# Patient Record
Sex: Male | Born: 1960 | Race: White | Smoking: Former smoker
Health system: Southern US, Community
[De-identification: ages and names within clinical notes are randomized; demographics above are authoritative.]

## PROBLEM LIST (undated history)

## (undated) DIAGNOSIS — E785 Hyperlipidemia, unspecified: Secondary | ICD-10-CM

## (undated) HISTORY — DX: Hyperlipidemia, unspecified: E78.5

---

## 2012-07-17 ENCOUNTER — Encounter: Payer: Self-pay | Admitting: Gastroenterology

## 2012-08-13 ENCOUNTER — Encounter: Payer: Self-pay | Admitting: Gastroenterology

## 2014-02-01 ENCOUNTER — Encounter: Payer: Self-pay | Admitting: Nurse Practitioner

## 2014-06-11 ENCOUNTER — Ambulatory Visit (INDEPENDENT_AMBULATORY_CARE_PROVIDER_SITE_OTHER): Payer: 59

## 2014-06-11 ENCOUNTER — Ambulatory Visit (INDEPENDENT_AMBULATORY_CARE_PROVIDER_SITE_OTHER): Payer: 59 | Admitting: Nurse Practitioner

## 2014-06-11 ENCOUNTER — Encounter: Payer: Self-pay | Admitting: Nurse Practitioner

## 2014-06-11 VITALS — BP 148/105 | HR 110 | Temp 96.8°F | Ht 69.0 in | Wt 201.0 lb

## 2014-06-11 DIAGNOSIS — J449 Chronic obstructive pulmonary disease, unspecified: Secondary | ICD-10-CM | POA: Insufficient documentation

## 2014-06-11 DIAGNOSIS — Z720 Tobacco use: Secondary | ICD-10-CM

## 2014-06-11 DIAGNOSIS — Z125 Encounter for screening for malignant neoplasm of prostate: Secondary | ICD-10-CM

## 2014-06-11 DIAGNOSIS — Z0000 Encounter for general adult medical examination without abnormal findings: Secondary | ICD-10-CM

## 2014-06-11 DIAGNOSIS — Z87891 Personal history of nicotine dependence: Secondary | ICD-10-CM

## 2014-06-11 DIAGNOSIS — F1020 Alcohol dependence, uncomplicated: Secondary | ICD-10-CM

## 2014-06-11 DIAGNOSIS — J410 Simple chronic bronchitis: Secondary | ICD-10-CM

## 2014-06-11 DIAGNOSIS — E785 Hyperlipidemia, unspecified: Secondary | ICD-10-CM

## 2014-06-11 LAB — POCT CBC
GRANULOCYTE PERCENT: 72.7 % (ref 37–80)
HCT, POC: 50.3 % (ref 43.5–53.7)
Hemoglobin: 15.8 g/dL (ref 14.1–18.1)
LYMPH, POC: 2.2 (ref 0.6–3.4)
MCH, POC: 28.6 pg (ref 27–31.2)
MCHC: 31.5 g/dL — AB (ref 31.8–35.4)
MCV: 90.9 fL (ref 80–97)
MPV: 7.6 fL (ref 0–99.8)
PLATELET COUNT, POC: 326 10*3/uL (ref 142–424)
POC GRANULOCYTE: 7.3 — AB (ref 2–6.9)
POC LYMPH PERCENT: 21.9 %L (ref 10–50)
RBC: 5.5 M/uL (ref 4.69–6.13)
RDW, POC: 13 %
WBC: 10 10*3/uL (ref 4.6–10.2)

## 2014-06-11 MED ORDER — PRAVASTATIN SODIUM 40 MG PO TABS: 40.0000 mg | ORAL_TABLET | Freq: Every day | ORAL | Status: DC

## 2014-06-11 NOTE — Patient Instructions (Signed)

## 2014-06-11 NOTE — Addendum Note (Signed)
Addended by: Prescott GumLAND, Karim Aiello M on: 06/11/2014 04:01 PM   Modules accepted: Kipp BroodSmartSet

## 2014-06-11 NOTE — Progress Notes (Addendum)
   Subjective:    Patient ID: Philip Ashley, male    DOB: 10/29/1960, 54 y.o.   MRN: [id number]   Patient here today for annual physical and  follow up of chronic medical problems.   Hyperlipidemia This is a chronic problem. The current episode started more than 1 year ago. The problem is uncontrolled. Recent lipid tests were reviewed and are variable. Current antihyperlipidemic treatment includes statins. The current treatment provides moderate improvement of lipids. Compliance problems include adherence to diet and adherence to exercise.  Risk factors for coronary artery disease include dyslipidemia and male sex.  Alcholism  Says that he drinks 4-5 days a week- Says he only drinks a couple of beers or wine when he does drink. He doesn't think that it is a problem. COPD On  No inhalers- stopped smoking 3 weeks ago- using vapor  Review of Systems  Constitutional: Negative.   HENT: Negative.   Respiratory: Negative.   Cardiovascular: Negative.   Gastrointestinal: Negative.   Genitourinary: Negative.   Neurological: Negative.   Psychiatric/Behavioral: Negative.   All other systems reviewed and are negative.      Objective:   Physical Exam  Constitutional: He is oriented to person, place, and time. He appears well-developed and well-nourished.  HENT:  Head: Normocephalic.  Right Ear: External ear normal.  Left Ear: External ear normal.  Nose: Nose normal.  Mouth/Throat: Oropharynx is clear and moist.  Eyes: EOM are normal. Pupils are equal, round, and reactive to light.  Neck: Normal range of motion. Neck supple. No JVD present. No thyromegaly present.  Cardiovascular: Normal rate, regular rhythm, normal heart sounds and intact distal pulses.  Exam reveals no gallop and no friction rub.   No murmur heard. Pulmonary/Chest: Effort normal and breath sounds normal. No respiratory distress. He has no wheezes. He has no rales. He exhibits no tenderness.  Abdominal: Soft. Bowel sounds  are normal. He exhibits no mass. There is no tenderness.  Musculoskeletal: Normal range of motion. He exhibits no edema.  Lymphadenopathy:    He has no cervical adenopathy.  Neurological: He is alert and oriented to person, place, and time. No cranial nerve deficit.  Skin: Skin is warm and dry.  Psychiatric: He has a normal mood and affect. His behavior is normal. Judgment and thought content normal.   BP 148/105 mmHg  Pulse 110  Temp(Src) 96.8 F (36 C) (Oral)  Ht $R'5\' 9"'Mv$  (1.753 m)  Wt 201 lb (91.173 kg)  BMI 29.67 kg/m2  EKG- NSR-Mary-Margaret Hassell Done, FNP  Chest x ray- No acute findings-Preliminary reading by Ronnald Collum, FNP  [facility]       Assessment & Plan:  1. Simple chronic bronchitis Do not start smoking again  2. Hyperlipidemia with target LDL less than 100 Low fat diet - EKG 12-Lead - CMP14+EGFR - NMR, lipoprofile  3. Alcoholism Stop drinking  4. Annual physical exam - POCT CBC - Thyroid Panel With TSH  5. Smoking hx - DG Chest 2 View; Future  6. Prostate cancer screening - PSA, total and free   Keep diary of blood pressure - let me know if stays above 465 systolic. Labs pending Health maintenance reviewed Diet and exercise encouraged Continue all meds Follow up  In 6 months    [place], FNP

## 2014-06-12 LAB — CMP14+EGFR
A/G RATIO: 2.3 (ref 1.1–2.5)
ALT: 36 IU/L (ref 0–44)
AST: 21 IU/L (ref 0–40)
Albumin: 4.8 g/dL (ref 3.5–5.5)
Alkaline Phosphatase: 67 IU/L (ref 39–117)
BUN/Creatinine Ratio: 13 (ref 9–20)
BUN: 12 mg/dL (ref 6–24)
CALCIUM: 9.8 mg/dL (ref 8.7–10.2)
CHLORIDE: 100 mmol/L (ref 97–108)
CO2: 27 mmol/L (ref 18–29)
CREATININE: 0.9 mg/dL (ref 0.76–1.27)
GFR calc Af Amer: 110 mL/min/{1.73_m2} (ref >59)
GFR calc non Af Amer: 95 mL/min/{1.73_m2} (ref >59)
GLOBULIN, TOTAL: 2.1 g/dL (ref 1.5–4.5)
Glucose: 76 mg/dL (ref 65–99)
Potassium: 4.6 mmol/L (ref 3.5–5.2)
Sodium: 143 mmol/L (ref 134–144)
TOTAL PROTEIN: 6.9 g/dL (ref 6.0–8.5)
Total Bilirubin: 0.5 mg/dL (ref 0.0–1.2)

## 2014-06-12 LAB — THYROID PANEL WITH TSH
FREE THYROXINE INDEX: 2 (ref 1.2–4.9)
T3 Uptake Ratio: 28 % (ref 24–39)
T4 TOTAL: 7.3 ug/dL (ref 4.5–12.0)
TSH: 4.35 u[IU]/mL (ref 0.450–4.500)

## 2014-06-12 LAB — NMR, LIPOPROFILE
CHOLESTEROL: 220 mg/dL — AB (ref 100–199)
HDL Cholesterol by NMR: 67 mg/dL (ref >39)
HDL PARTICLE NUMBER: 47.9 umol/L (ref >=30.5)
LDL Particle Number: 1561 nmol/L — ABNORMAL HIGH (ref <1000)
LDL SIZE: 20.5 nm (ref >20.5)
LDL-C: 103 mg/dL — AB (ref 0–99)
LP-IR SCORE: 74 — AB (ref <=45)
SMALL LDL PARTICLE NUMBER: 723 nmol/L — AB (ref <=527)
Triglycerides by NMR: 250 mg/dL — ABNORMAL HIGH (ref 0–149)

## 2014-06-12 LAB — PSA, TOTAL AND FREE
PSA FREE PCT: 21 %
PSA FREE: 0.21 ng/mL
PSA: 1 ng/mL (ref 0.0–4.0)

## 2014-06-17 ENCOUNTER — Other Ambulatory Visit: Payer: Self-pay | Admitting: *Deleted

## 2014-06-17 MED ORDER — BUDESONIDE-FORMOTEROL FUMARATE 80-4.5 MCG/ACT IN AERO: 2.0000 | INHALATION_SPRAY | Freq: Two times a day (BID) | RESPIRATORY_TRACT | Status: DC

## 2014-06-22 ENCOUNTER — Other Ambulatory Visit: Payer: Self-pay | Admitting: Nurse Practitioner

## 2014-06-22 MED ORDER — LISINOPRIL 20 MG PO TABS: 20.0000 mg | ORAL_TABLET | Freq: Every day | ORAL | Status: DC

## 2014-10-21 ENCOUNTER — Other Ambulatory Visit: Payer: Self-pay | Admitting: *Deleted

## 2014-10-21 MED ORDER — BUDESONIDE-FORMOTEROL FUMARATE 80-4.5 MCG/ACT IN AERO: 2.0000 | INHALATION_SPRAY | Freq: Two times a day (BID) | RESPIRATORY_TRACT | Status: DC

## 2014-11-24 ENCOUNTER — Other Ambulatory Visit: Payer: Self-pay | Admitting: *Deleted

## 2014-11-24 MED ORDER — LISINOPRIL 20 MG PO TABS: 20.0000 mg | ORAL_TABLET | Freq: Every day | ORAL | Status: DC

## 2014-12-21 ENCOUNTER — Other Ambulatory Visit: Payer: Self-pay | Admitting: *Deleted

## 2014-12-21 MED ORDER — PRAVASTATIN SODIUM 40 MG PO TABS: 40.0000 mg | ORAL_TABLET | Freq: Every day | ORAL | Status: DC

## 2014-12-21 MED ORDER — BUDESONIDE-FORMOTEROL FUMARATE 80-4.5 MCG/ACT IN AERO: 2.0000 | INHALATION_SPRAY | Freq: Two times a day (BID) | RESPIRATORY_TRACT | Status: DC

## 2014-12-21 MED ORDER — LISINOPRIL 20 MG PO TABS: 20.0000 mg | ORAL_TABLET | Freq: Every day | ORAL | Status: DC

## 2015-01-21 ENCOUNTER — Ambulatory Visit (INDEPENDENT_AMBULATORY_CARE_PROVIDER_SITE_OTHER): Payer: 59 | Admitting: Nurse Practitioner

## 2015-01-21 ENCOUNTER — Encounter: Payer: Self-pay | Admitting: Nurse Practitioner

## 2015-01-21 VITALS — BP 152/86 | HR 77 | Temp 97.7°F | Ht 69.0 in | Wt 202.0 lb

## 2015-01-21 DIAGNOSIS — I10 Essential (primary) hypertension: Secondary | ICD-10-CM | POA: Diagnosis not present

## 2015-01-21 DIAGNOSIS — Z6829 Body mass index (BMI) 29.0-29.9, adult: Secondary | ICD-10-CM | POA: Diagnosis not present

## 2015-01-21 DIAGNOSIS — J410 Simple chronic bronchitis: Secondary | ICD-10-CM

## 2015-01-21 DIAGNOSIS — E785 Hyperlipidemia, unspecified: Secondary | ICD-10-CM

## 2015-01-21 DIAGNOSIS — F1020 Alcohol dependence, uncomplicated: Secondary | ICD-10-CM | POA: Diagnosis not present

## 2015-01-21 DIAGNOSIS — Z1159 Encounter for screening for other viral diseases: Secondary | ICD-10-CM

## 2015-01-21 MED ORDER — PRAVASTATIN SODIUM 40 MG PO TABS: 40.0000 mg | ORAL_TABLET | Freq: Every day | ORAL | Status: DC

## 2015-01-21 MED ORDER — LISINOPRIL 40 MG PO TABS: 40.0000 mg | ORAL_TABLET | Freq: Every day | ORAL | Status: DC

## 2015-01-21 MED ORDER — BUDESONIDE-FORMOTEROL FUMARATE 80-4.5 MCG/ACT IN AERO: 2.0000 | INHALATION_SPRAY | Freq: Two times a day (BID) | RESPIRATORY_TRACT | Status: DC

## 2015-01-21 NOTE — Progress Notes (Signed)
Subjective:    Patient ID: Philip Ashley, male    DOB: [birth year], 54 y.o.   MRN: [id number]   Patient here today for annual physical and  follow up of chronic medical problems.   Hyperlipidemia This is a chronic problem. The current episode started more than 1 year ago. The problem is uncontrolled. Recent lipid tests were reviewed and are variable. Pertinent negatives include no chest pain or shortness of breath. Current antihyperlipidemic treatment includes statins. The current treatment provides moderate improvement of lipids. Compliance problems include adherence to diet and adherence to exercise.  Risk factors for coronary artery disease include dyslipidemia and male sex.  Hypertension This is a chronic problem. The current episode started more than 1 year ago. The problem has been waxing and waning since onset. The problem is uncontrolled. Pertinent negatives include no chest pain, malaise/fatigue or shortness of breath. Risk factors for coronary artery disease include dyslipidemia and male gender. Past treatments include ACE inhibitors. The current treatment provides moderate improvement. Compliance problems include diet and exercise.  There is no history of CAD/MI or CVA.  Alcholism  Says that he drinks 4-5 days a week- Says he only drinks a couple of beers or wine when he does drink. He doesn't think that it is a problem. COPD On  No inhalers- stopped smoking 3 weeks ago- using vapor  Review of Systems  Constitutional: Negative.  Negative for malaise/fatigue.  HENT: Negative.   Respiratory: Negative.  Negative for shortness of breath.   Cardiovascular: Negative.  Negative for chest pain.  Gastrointestinal: Negative.   Genitourinary: Negative.   Neurological: Negative.   Psychiatric/Behavioral: Negative.   All other systems reviewed and are negative.      Objective:   Physical Exam  Constitutional: He is oriented to person, place, and time. He appears well-developed and  well-nourished.  HENT:  Head: Normocephalic.  Right Ear: External ear normal.  Left Ear: External ear normal.  Nose: Nose normal.  Mouth/Throat: Oropharynx is clear and moist.  Eyes: EOM are normal. Pupils are equal, round, and reactive to light.  Neck: Normal range of motion. Neck supple. No JVD present. No thyromegaly present.  Cardiovascular: Normal rate, regular rhythm, normal heart sounds and intact distal pulses.  Exam reveals no gallop and no friction rub.   No murmur heard. Pulmonary/Chest: Effort normal and breath sounds normal. No respiratory distress. He has no wheezes. He has no rales. He exhibits no tenderness.  Abdominal: Soft. Bowel sounds are normal. He exhibits no mass. There is no tenderness.  Musculoskeletal: Normal range of motion. He exhibits no edema.  Lymphadenopathy:    He has no cervical adenopathy.  Neurological: He is alert and oriented to person, place, and time. No cranial nerve deficit.  Skin: Skin is warm and dry.  Psychiatric: He has a normal mood and affect. His behavior is normal. Judgment and thought content normal.   BP 152/86 mmHg  Pulse 77  Temp(Src) 97.7 F (36.5 C) (Oral)  Ht '5\' 9"'  (1.753 m)  Wt 202 lb (91.627 kg)  BMI 29.82 kg/m2      Assessment & Plan:  1. Simple chronic bronchitis Avoid cigarette smoke - budesonide-formoterol (SYMBICORT) 80-4.5 MCG/ACT inhaler; Inhale 2 puffs into the lungs 2 (two) times daily.  Dispense: 1 Inhaler; Refill: 0  2. Hyperlipidemia with target LDL less than 100 Low fat diet - Lipid panel - pravastatin (PRAVACHOL) 40 MG tablet; Take 1 tablet (40 mg total) by mouth daily.  Dispense: 30 tablet; Refill:  0  3. Alcoholism Avoid alcohol  4. BMI 29.0-29.9,adult Discussed diet and exercise for person with BMI >25 Will recheck weight in 3-6 months   5. Essential hypertension Do not add salt to diet Increased lisinopril from 20 mg to 40 mg daily - CMP14+EGFR - lisinopril (PRINIVIL,ZESTRIL) 40 MG tablet;  Take 1 tablet (40 mg total) by mouth daily.  Dispense: 90 tablet; Refill: 3  6. Need for hepatitis C screening test - Hepatitis C antibody    Labs pending Health maintenance reviewed Diet and exercise encouraged Continue all meds Follow up  In 3 months   [place], FNP

## 2015-01-21 NOTE — Patient Instructions (Signed)

## 2015-01-22 LAB — CMP14+EGFR
A/G RATIO: 2.6 — AB (ref 1.1–2.5)
ALBUMIN: 4.6 g/dL (ref 3.5–5.5)
ALT: 41 IU/L (ref 0–44)
AST: 27 IU/L (ref 0–40)
Alkaline Phosphatase: 65 IU/L (ref 39–117)
BILIRUBIN TOTAL: 0.4 mg/dL (ref 0.0–1.2)
BUN/Creatinine Ratio: 15 (ref 9–20)
BUN: 14 mg/dL (ref 6–24)
CHLORIDE: 101 mmol/L (ref 97–108)
CO2: 27 mmol/L (ref 18–29)
Calcium: 9.8 mg/dL (ref 8.7–10.2)
Creatinine, Ser: 0.94 mg/dL (ref 0.76–1.27)
GFR calc non Af Amer: 90 mL/min/{1.73_m2} (ref >59)
GFR, EST AFRICAN AMERICAN: 104 mL/min/{1.73_m2} (ref >59)
GLOBULIN, TOTAL: 1.8 g/dL (ref 1.5–4.5)
Glucose: 77 mg/dL (ref 65–99)
POTASSIUM: 4.5 mmol/L (ref 3.5–5.2)
SODIUM: 143 mmol/L (ref 134–144)
TOTAL PROTEIN: 6.4 g/dL (ref 6.0–8.5)

## 2015-01-22 LAB — HEPATITIS C ANTIBODY

## 2015-01-22 LAB — LIPID PANEL
Chol/HDL Ratio: 3.1 ratio units (ref 0.0–5.0)
Cholesterol, Total: 181 mg/dL (ref 100–199)
HDL: 58 mg/dL (ref >39)
LDL Calculated: 88 mg/dL (ref 0–99)
Triglycerides: 176 mg/dL — ABNORMAL HIGH (ref 0–149)
VLDL Cholesterol Cal: 35 mg/dL (ref 5–40)

## 2015-01-24 ENCOUNTER — Other Ambulatory Visit: Payer: Self-pay | Admitting: *Deleted

## 2015-01-24 DIAGNOSIS — E785 Hyperlipidemia, unspecified: Secondary | ICD-10-CM

## 2015-01-24 MED ORDER — PRAVASTATIN SODIUM 40 MG PO TABS: 40.0000 mg | ORAL_TABLET | Freq: Every day | ORAL | Status: DC

## 2015-02-01 ENCOUNTER — Ambulatory Visit (INDEPENDENT_AMBULATORY_CARE_PROVIDER_SITE_OTHER): Payer: 59 | Admitting: Nurse Practitioner

## 2015-02-01 ENCOUNTER — Encounter: Payer: Self-pay | Admitting: Nurse Practitioner

## 2015-02-01 VITALS — BP 184/103 | HR 83 | Temp 97.6°F | Ht 69.0 in | Wt 201.0 lb

## 2015-02-01 DIAGNOSIS — I10 Essential (primary) hypertension: Secondary | ICD-10-CM | POA: Diagnosis not present

## 2015-02-01 MED ORDER — AMLODIPINE BESYLATE 10 MG PO TABS: 10.0000 mg | ORAL_TABLET | Freq: Every day | ORAL | Status: DC

## 2015-02-01 NOTE — Addendum Note (Signed)
Addended by: Bennie Pierini on: 02/01/2015 09:08 AM   Modules accepted: Kipp Brood

## 2015-02-01 NOTE — Patient Instructions (Signed)

## 2015-02-01 NOTE — Progress Notes (Signed)
   Subjective:    Patient ID: Philip Ashley, male    DOB: 1960/06/18, 54 y.o.   MRN: [id number]  HPI Patient in c/o blood pressure being elevated- was seen 1 week ago and lisinorpil was increased from  to 40 mg daily. Blood pressure is still running 170's systolic and 100's diastolic. He feels very tired and weak. Was unable to work yesterday and today. Having lots of trouble sleeping and wonders if that is causing problem.   Review of Systems  Constitutional: Positive for fatigue.  HENT: Negative.   Eyes: Negative for photophobia and visual disturbance.  Respiratory: Negative.   Cardiovascular: Negative.   Gastrointestinal: Negative.   Genitourinary: Negative.   Neurological: Negative.   Psychiatric/Behavioral: Negative.   All other systems reviewed and are negative.      Objective:   Physical Exam  Constitutional: He is oriented to person, place, and time. He appears well-developed and well-nourished.  Cardiovascular: Normal rate, regular rhythm and normal heart sounds.   Pulmonary/Chest: Effort normal and breath sounds normal.  Neurological: He is alert and oriented to person, place, and time.  Skin: Skin is warm.  Psychiatric: He has a normal mood and affect. His behavior is normal. Judgment and thought content normal.   BP 184/103 mmHg  Pulse 83  Temp(Src) 97.6 F (36.4 C) (Oral)  Ht  (1.753 m)  Wt 201 lb (91.173 kg)  BMI 29.67 kg/m2        Assessment & Plan:   1. Essential hypertension    Meds ordered this encounter  Medications  . amLODipine (NORVASC) 10 MG tablet    Sig: Take 1 tablet (10 mg total) by mouth daily.    Dispense:  90 tablet    Refill:  3    Order Specific Question:  Supervising Provider    Answer:  Ernestina Penna [[id number]]   Continue lisinopril as rx Do not add salt to diet Get someone to check blood pressure at home RTO prn  Mary-Margaret Daphine Deutscher, FNP

## 2015-02-03 ENCOUNTER — Ambulatory Visit: Payer: 59 | Admitting: Nurse Practitioner

## 2015-03-31 ENCOUNTER — Encounter: Payer: Self-pay | Admitting: *Deleted

## 2015-06-25 IMAGING — CR DG CHEST 2V
2 series · 2 of 2 positions shown · non-contrast
Comparison: None.

CLINICAL DATA: Annual physical.  Smoker.

EXAM:
CHEST  2 VIEW

[view not recorded (1 of 2)]
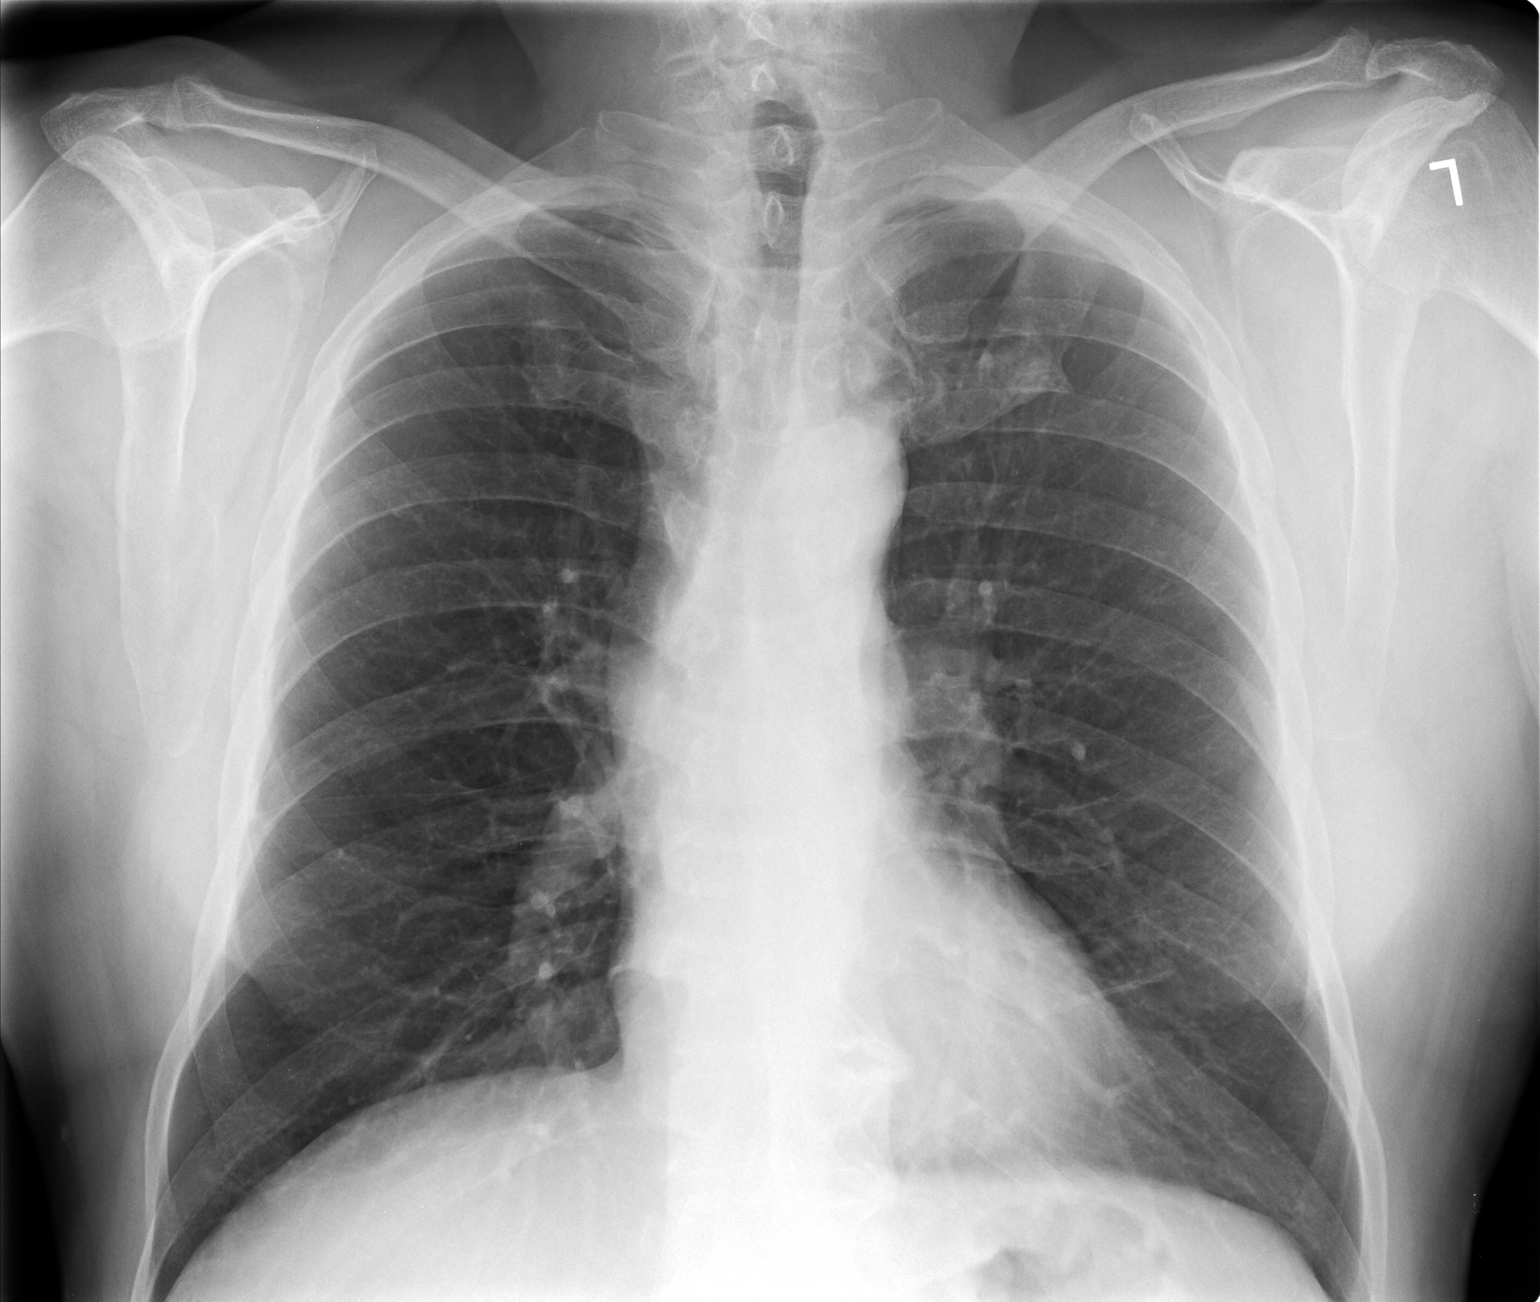

[view not recorded (2 of 2)]
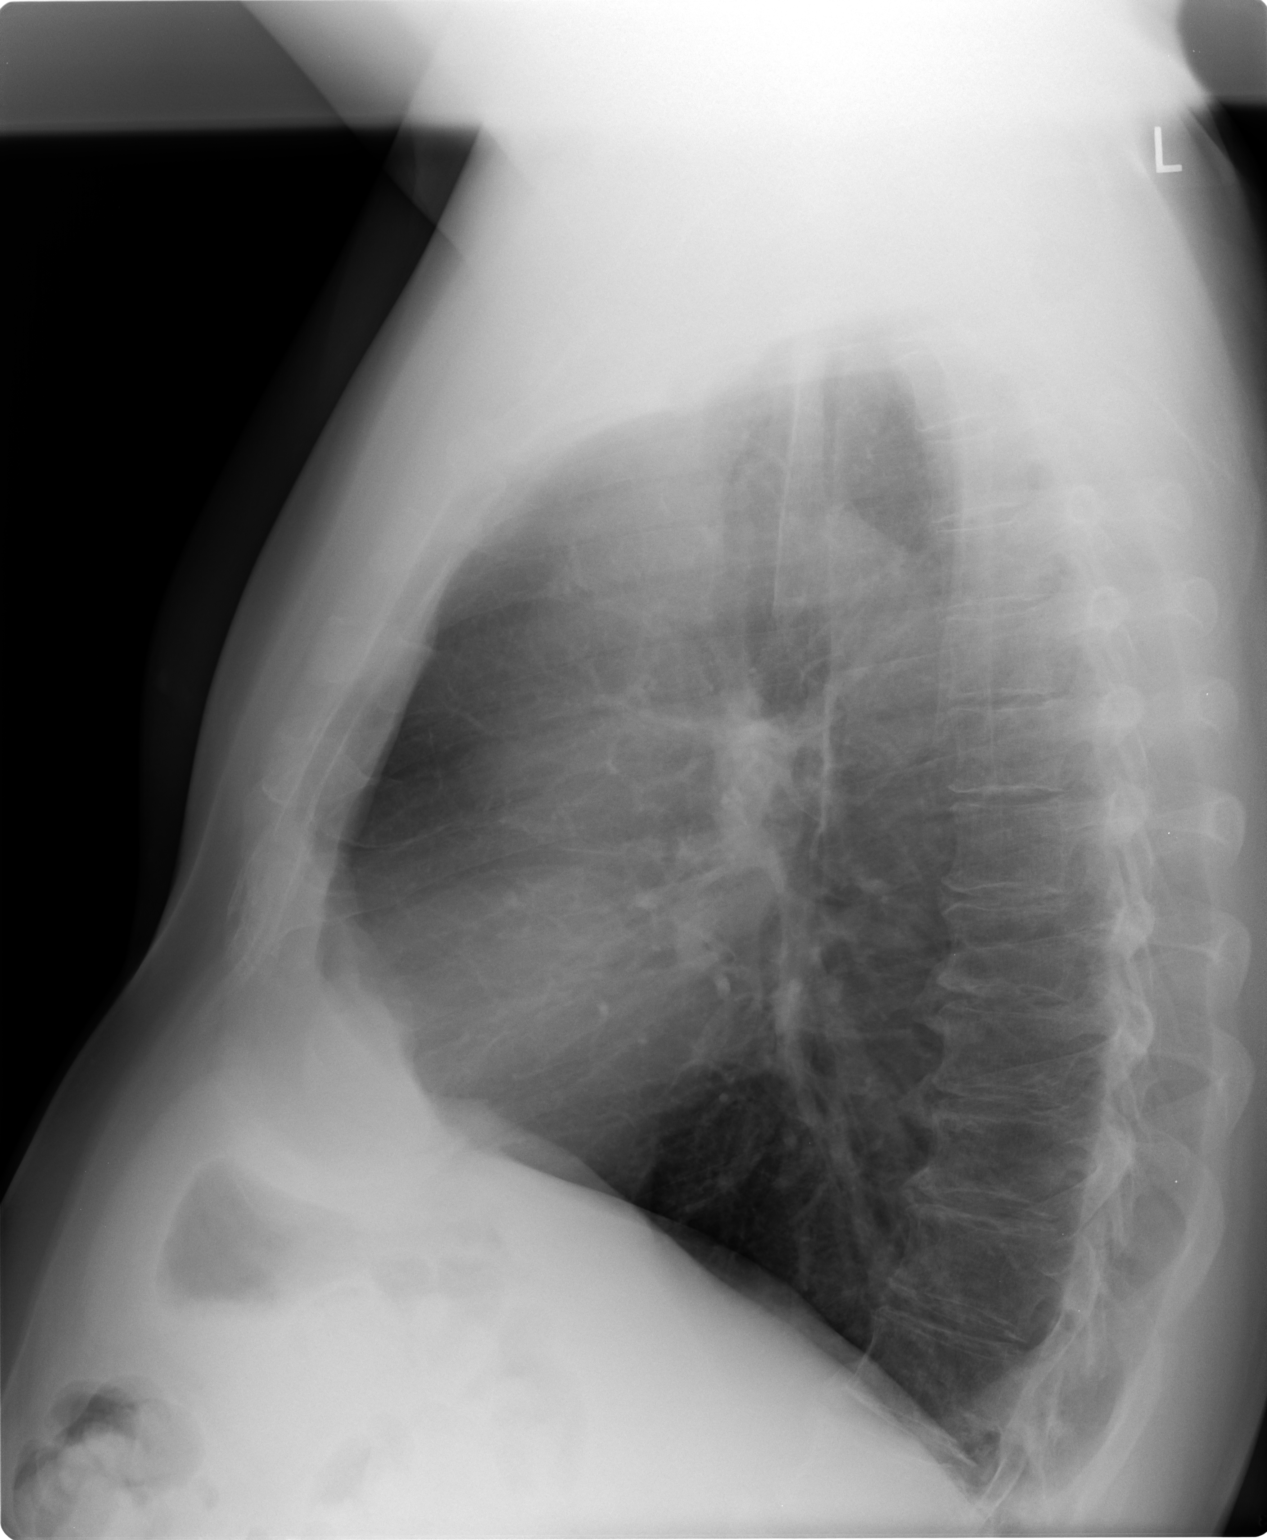

[2 of 2 positions shown; findings below may reference images not displayed]

FINDINGS: The heart size and mediastinal contours are within normal limits.
Both lungs are clear. Mild hyperinflation is suspicious for COPD. No
evidence of pleural effusion. No mass or lymphadenopathy identified.
Mild lower thoracic spine degenerative changes noted.
IMPRESSION: Probable COPD.  No active cardiopulmonary disease.

## 2015-07-29 ENCOUNTER — Other Ambulatory Visit: Payer: Self-pay | Admitting: Nurse Practitioner

## 2015-11-04 ENCOUNTER — Ambulatory Visit (INDEPENDENT_AMBULATORY_CARE_PROVIDER_SITE_OTHER): Payer: 59 | Admitting: Family

## 2015-11-04 ENCOUNTER — Encounter: Payer: Self-pay | Admitting: Family

## 2015-11-04 VITALS — BP 139/83 | Temp 97.4°F | Ht 69.0 in | Wt 202.0 lb

## 2015-11-04 DIAGNOSIS — M5442 Lumbago with sciatica, left side: Secondary | ICD-10-CM | POA: Diagnosis not present

## 2015-11-04 DIAGNOSIS — Z1211 Encounter for screening for malignant neoplasm of colon: Secondary | ICD-10-CM | POA: Diagnosis not present

## 2015-11-04 MED ORDER — NAPROXEN 500 MG PO TABS: 500.0000 mg | ORAL_TABLET | Freq: Two times a day (BID) | ORAL | Status: DC

## 2015-11-04 MED ORDER — KETOROLAC TROMETHAMINE 60 MG/2ML IM SOLN
60.0000 mg | Freq: Once | INTRAMUSCULAR | Status: AC
  Administered 2015-11-04: 60 mg via INTRAMUSCULAR

## 2015-11-04 MED ORDER — CYCLOBENZAPRINE HCL 5 MG PO TABS: 5.0000 mg | ORAL_TABLET | Freq: Three times a day (TID) | ORAL | Status: DC | PRN

## 2015-11-04 MED ORDER — METHYLPREDNISOLONE ACETATE 80 MG/ML IJ SUSP
80.0000 mg | Freq: Once | INTRAMUSCULAR | Status: AC
  Administered 2015-11-04: 80 mg via INTRAMUSCULAR

## 2015-11-04 NOTE — Progress Notes (Signed)
   Subjective:    Patient ID: Philip Ashley, male    DOB: 1961/04/22, 55 y.o.   MRN: [id number]  Back Pain This is a new problem. The current episode started 1 to 4 weeks ago. The problem occurs constantly. The problem is unchanged. The pain is present in the lumbar spine. The quality of the pain is described as aching. The pain radiates to the left foot and left thigh. The pain is at a severity of 8/10. The pain is moderate. The symptoms are aggravated by bending, standing and twisting. Associated symptoms include leg pain, numbness, tingling and weakness. Pertinent negatives include no bladder incontinence, bowel incontinence, dysuria, fever or pelvic pain. Risk factors include obesity. He has tried analgesics, muscle relaxant and NSAIDs for the symptoms. The treatment provided mild relief.      Review of Systems  Constitutional: Negative for fever.  HENT: Negative.   Respiratory: Negative.   Cardiovascular: Negative.   Gastrointestinal: Negative.  Negative for bowel incontinence.  Endocrine: Negative.   Genitourinary: Negative.  Negative for bladder incontinence, dysuria and pelvic pain.  Musculoskeletal: Positive for back pain.  Neurological: Positive for tingling, weakness and numbness.  Hematological: Negative.   Psychiatric/Behavioral: Negative.   All other systems reviewed and are negative.      Objective:   Physical Exam  Constitutional: He is oriented to person, place, and time. He appears well-developed and well-nourished. No distress.  HENT:  Head: Normocephalic.  Eyes: Pupils are equal, round, and reactive to light. Right eye exhibits no discharge. Left eye exhibits no discharge.  Neck: Normal range of motion. Neck supple. No thyromegaly present.  Cardiovascular: Normal rate, regular rhythm, normal heart sounds and intact distal pulses.   No murmur heard. Pulmonary/Chest: Effort normal and breath sounds normal. No respiratory distress. He has no wheezes.  Abdominal:  Soft. Bowel sounds are normal. He exhibits no distension. There is no tenderness.  Musculoskeletal: Normal range of motion. He exhibits no edema or tenderness.  Neurological: He is alert and oriented to person, place, and time.  Skin: Skin is warm and dry. No rash noted. No erythema.  Psychiatric: He has a normal mood and affect. His behavior is normal. Judgment and thought content normal.  Vitals reviewed.   BP 139/83 mmHg  Temp(Src) 97.4 F (36.3 C) (Oral)  Ht 5\' 9"  (1.753 m)  Wt 202 lb (91.627 kg)  BMI 29.82 kg/m2       Assessment & Plan:  1. Left-sided low back pain with left-sided sciatica -Rest -Ice and heat as needed -ROM exercises and stretches discussed- Handout given - RTO prn  - methylPREDNISolone acetate (DEPO-MEDROL) injection 80 mg; Inject 1 mL (80 mg total) into the muscle once. - ketorolac (TORADOL) injection 60 mg; Inject 2 mLs (60 mg total) into the muscle once. - naproxen (NAPROSYN) 500 MG tablet; Take 1 tablet (500 mg total) by mouth 2 (two) times daily with a meal.  Dispense: 60 tablet; Refill: 1 - cyclobenzaprine (FLEXERIL) 5 MG tablet; Take 1 tablet (5 mg total) by mouth 3 (three) times daily as needed for muscle spasms.  Dispense: 60 tablet; Refill: 1   2. Colon cancer screening - Fecal occult blood, imunochemical; Future  Jannifer Rodneyhristy Hawks, FNP

## 2015-11-04 NOTE — Patient Instructions (Signed)
Sciatica With Rehab The sciatic nerve runs from the back down the leg and is responsible for sensation and control of the muscles in the back (posterior) side of the thigh, lower leg, and foot. Sciatica is a condition that is characterized by inflammation of this nerve.  SYMPTOMS   Signs of nerve damage, including numbness and/or weakness along the posterior side of the lower extremity.  Pain in the back of the thigh that may also travel down the leg.  Pain that worsens when sitting for long periods of time.  Occasionally, pain in the back or buttock. CAUSES  Inflammation of the sciatic nerve is the cause of sciatica. The inflammation is due to something irritating the nerve. Common sources of irritation include:  Sitting for long periods of time.  Direct trauma to the nerve.  Arthritis of the spine.  Herniated or ruptured disk.  Slipping of the vertebrae (spondylolisthesis).  Pressure from soft tissues, such as muscles or ligament-like tissue (fascia). RISK INCREASES WITH:  Sports that place pressure or stress on the spine (football or weightlifting).  Poor strength and flexibility.  Failure to warm up properly before activity.  Family history of low back pain or disk disorders.  Previous back injury or surgery.  Poor body mechanics, especially when lifting, or poor posture. PREVENTION   Warm up and stretch properly before activity.  Maintain physical fitness:  Strength, flexibility, and endurance.  Cardiovascular fitness.  Learn and use proper technique, especially with posture and lifting. When possible, have coach correct improper technique.  Avoid activities that place stress on the spine. PROGNOSIS If treated properly, then sciatica usually resolves within 6 weeks. However, occasionally surgery is necessary.  RELATED COMPLICATIONS   Permanent nerve damage, including pain, numbness, tingle, or weakness.  Chronic back pain.  Risks of surgery: infection,  bleeding, nerve damage, or damage to surrounding tissues. TREATMENT Treatment initially involves resting from any activities that aggravate your symptoms. The use of ice and medication may help reduce pain and inflammation. The use of strengthening and stretching exercises may help reduce pain with activity. These exercises may be performed at home or with referral to a therapist. A therapist may recommend further treatments, such as transcutaneous electronic nerve stimulation (TENS) or ultrasound. Your caregiver may recommend corticosteroid injections to help reduce inflammation of the sciatic nerve. If symptoms persist despite non-surgical (conservative) treatment, then surgery may be recommended. MEDICATION  If pain medication is necessary, then nonsteroidal anti-inflammatory medications, such as aspirin and ibuprofen, or other minor pain relievers, such as acetaminophen, are often recommended.  Do not take pain medication for 7 days before surgery.  Prescription pain relievers may be given if deemed necessary by your caregiver. Use only as directed and only as much as you need.  Ointments applied to the skin may be helpful.  Corticosteroid injections may be given by your caregiver. These injections should be reserved for the most serious cases, because they may only be given a certain number of times. HEAT AND COLD  Cold treatment (icing) relieves pain and reduces inflammation. Cold treatment should be applied for 10 to 15 minutes every 2 to 3 hours for inflammation and pain and immediately after any activity that aggravates your symptoms. Use ice packs or massage the area with a piece of ice (ice massage).  Heat treatment may be used prior to performing the stretching and strengthening activities prescribed by your caregiver, physical therapist, or athletic trainer. Use a heat pack or soak the injury in warm water.   SEEK MEDICAL CARE IF:  Treatment seems to offer no benefit, or the condition  worsens.  Any medications produce adverse side effects. EXERCISES  RANGE OF MOTION (ROM) AND STRETCHING EXERCISES - Sciatica Most people with sciatic will find that their symptoms worsen with either excessive bending forward (flexion) or arching at the low back (extension). The exercises which will help resolve your symptoms will focus on the opposite motion. Your physician, physical therapist or athletic trainer will help you determine which exercises will be most helpful to resolve your low back pain. Do not complete any exercises without first consulting with your clinician. Discontinue any exercises which worsen your symptoms until you speak to your clinician. If you have pain, numbness or tingling which travels down into your buttocks, leg or foot, the goal of the therapy is for these symptoms to move closer to your back and eventually resolve. Occasionally, these leg symptoms will get better, but your low back pain may worsen; this is typically an indication of progress in your rehabilitation. Be certain to be very alert to any changes in your symptoms and the activities in which you participated in the 24 hours prior to the change. Sharing this information with your clinician will allow him/her to most efficiently treat your condition. These exercises may help you when beginning to rehabilitate your injury. Your symptoms may resolve with or without further involvement from your physician, physical therapist or athletic trainer. While completing these exercises, remember:   Restoring tissue flexibility helps normal motion to return to the joints. This allows healthier, less painful movement and activity.  An effective stretch should be held for at least 30 seconds.  A stretch should never be painful. You should only feel a gentle lengthening or release in the stretched tissue. FLEXION RANGE OF MOTION AND STRETCHING EXERCISES: STRETCH - Flexion, Single Knee to Chest   Lie on a firm bed or floor  with both legs extended in front of you.  Keeping one leg in contact with the floor, bring your opposite knee to your chest. Hold your leg in place by either grabbing behind your thigh or at your knee.  Pull until you feel a gentle stretch in your low back. Hold __________ seconds.  Slowly release your grasp and repeat the exercise with the opposite side. Repeat __________ times. Complete this exercise __________ times per day.  STRETCH - Flexion, Double Knee to Chest  Lie on a firm bed or floor with both legs extended in front of you.  Keeping one leg in contact with the floor, bring your opposite knee to your chest.  Tense your stomach muscles to support your back and then lift your other knee to your chest. Hold your legs in place by either grabbing behind your thighs or at your knees.  Pull both knees toward your chest until you feel a gentle stretch in your low back. Hold __________ seconds.  Tense your stomach muscles and slowly return one leg at a time to the floor. Repeat __________ times. Complete this exercise __________ times per day.  STRETCH - Low Trunk Rotation   Lie on a firm bed or floor. Keeping your legs in front of you, bend your knees so they are both pointed toward the ceiling and your feet are flat on the floor.  Extend your arms out to the side. This will stabilize your upper body by keeping your shoulders in contact with the floor.  Gently and slowly drop both knees together to one side until   you feel a gentle stretch in your low back. Hold for __________ seconds.  Tense your stomach muscles to support your low back as you bring your knees back to the starting position. Repeat the exercise to the other side. Repeat __________ times. Complete this exercise __________ times per day  EXTENSION RANGE OF MOTION AND FLEXIBILITY EXERCISES: STRETCH - Extension, Prone on Elbows  Lie on your stomach on the floor, a bed will be too soft. Place your palms about shoulder  width apart and at the height of your head.  Place your elbows under your shoulders. If this is too painful, stack pillows under your chest.  Allow your body to relax so that your hips drop lower and make contact more completely with the floor.  Hold this position for __________ seconds.  Slowly return to lying flat on the floor. Repeat __________ times. Complete this exercise __________ times per day.  RANGE OF MOTION - Extension, Prone Press Ups  Lie on your stomach on the floor, a bed will be too soft. Place your palms about shoulder width apart and at the height of your head.  Keeping your back as relaxed as possible, slowly straighten your elbows while keeping your hips on the floor. You may adjust the placement of your hands to maximize your comfort. As you gain motion, your hands will come more underneath your shoulders.  Hold this position __________ seconds.  Slowly return to lying flat on the floor. Repeat __________ times. Complete this exercise __________ times per day.  STRENGTHENING EXERCISES - Sciatica  These exercises may help you when beginning to rehabilitate your injury. These exercises should be done near your "sweet spot." This is the neutral, low-back arch, somewhere between fully rounded and fully arched, that is your least painful position. When performed in this safe range of motion, these exercises can be used for people who have either a flexion or extension based injury. These exercises may resolve your symptoms with or without further involvement from your physician, physical therapist or athletic trainer. While completing these exercises, remember:   Muscles can gain both the endurance and the strength needed for everyday activities through controlled exercises.  Complete these exercises as instructed by your physician, physical therapist or athletic trainer. Progress with the resistance and repetition exercises only as your caregiver advises.  You may  experience muscle soreness or fatigue, but the pain or discomfort you are trying to eliminate should never worsen during these exercises. If this pain does worsen, stop and make certain you are following the directions exactly. If the pain is still present after adjustments, discontinue the exercise until you can discuss the trouble with your clinician. STRENGTHENING - Deep Abdominals, Pelvic Tilt   Lie on a firm bed or floor. Keeping your legs in front of you, bend your knees so they are both pointed toward the ceiling and your feet are flat on the floor.  Tense your lower abdominal muscles to press your low back into the floor. This motion will rotate your pelvis so that your tail bone is scooping upwards rather than pointing at your feet or into the floor.  With a gentle tension and even breathing, hold this position for __________ seconds. Repeat __________ times. Complete this exercise __________ times per day.  STRENGTHENING - Abdominals, Crunches   Lie on a firm bed or floor. Keeping your legs in front of you, bend your knees so they are both pointed toward the ceiling and your feet are flat on the   floor. Cross your arms over your chest.  Slightly tip your chin down without bending your neck.  Tense your abdominals and slowly lift your trunk high enough to just clear your shoulder blades. Lifting higher can put excessive stress on the low back and does not further strengthen your abdominal muscles.  Control your return to the starting position. Repeat __________ times. Complete this exercise __________ times per day.  STRENGTHENING - Quadruped, Opposite UE/LE Lift  Assume a hands and knees position on a firm surface. Keep your hands under your shoulders and your knees under your hips. You may place padding under your knees for comfort.  Find your neutral spine and gently tense your abdominal muscles so that you can maintain this position. Your shoulders and hips should form a rectangle  that is parallel with the floor and is not twisted.  Keeping your trunk steady, lift your right hand no higher than your shoulder and then your left leg no higher than your hip. Make sure you are not holding your breath. Hold this position __________ seconds.  Continuing to keep your abdominal muscles tense and your back steady, slowly return to your starting position. Repeat with the opposite arm and leg. Repeat __________ times. Complete this exercise __________ times per day.  STRENGTHENING - Abdominals and Quadriceps, Straight Leg Raise   Lie on a firm bed or floor with both legs extended in front of you.  Keeping one leg in contact with the floor, bend the other knee so that your foot can rest flat on the floor.  Find your neutral spine, and tense your abdominal muscles to maintain your spinal position throughout the exercise.  Slowly lift your straight leg off the floor about 6 inches for a count of 15, making sure to not hold your breath.  Still keeping your neutral spine, slowly lower your leg all the way to the floor. Repeat this exercise with each leg __________ times. Complete this exercise __________ times per day. POSTURE AND BODY MECHANICS CONSIDERATIONS - Sciatica Keeping correct posture when sitting, standing or completing your activities will reduce the stress put on different body tissues, allowing injured tissues a chance to heal and limiting painful experiences. The following are general guidelines for improved posture. Your physician or physical therapist will provide you with any instructions specific to your needs. While reading these guidelines, remember:  The exercises prescribed by your provider will help you have the flexibility and strength to maintain correct postures.  The correct posture provides the optimal environment for your joints to work. All of your joints have less wear and tear when properly supported by a spine with good posture. This means you will  experience a healthier, less painful body.  Correct posture must be practiced with all of your activities, especially prolonged sitting and standing. Correct posture is as important when doing repetitive low-stress activities (typing) as it is when doing a single heavy-load activity (lifting). RESTING POSITIONS Consider which positions are most painful for you when choosing a resting position. If you have pain with flexion-based activities (sitting, bending, stooping, squatting), choose a position that allows you to rest in a less flexed posture. You would want to avoid curling into a fetal position on your side. If your pain worsens with extension-based activities (prolonged standing, working overhead), avoid resting in an extended position such as sleeping on your stomach. Most people will find more comfort when they rest with their spine in a more neutral position, neither too rounded nor too   arched. Lying on a non-sagging bed on your side with a pillow between your knees, or on your back with a pillow under your knees will often provide some relief. Keep in mind, being in any one position for a prolonged period of time, no matter how correct your posture, can still lead to stiffness. PROPER SITTING POSTURE In order to minimize stress and discomfort on your spine, you must sit with correct posture Sitting with good posture should be effortless for a healthy body. Returning to good posture is a gradual process. Many people can work toward this most comfortably by using various supports until they have the flexibility and strength to maintain this posture on their own. When sitting with proper posture, your ears will fall over your shoulders and your shoulders will fall over your hips. You should use the back of the chair to support your upper back. Your low back will be in a neutral position, just slightly arched. You may place a small pillow or folded towel at the base of your low back for support.  When  working at a desk, create an environment that supports good, upright posture. Without extra support, muscles fatigue and lead to excessive strain on joints and other tissues. Keep these recommendations in mind: CHAIR:   A chair should be able to slide under your desk when your back makes contact with the back of the chair. This allows you to work closely.  The chair's height should allow your eyes to be level with the upper part of your monitor and your hands to be slightly lower than your elbows. BODY POSITION  Your feet should make contact with the floor. If this is not possible, use a foot rest.  Keep your ears over your shoulders. This will reduce stress on your neck and low back. INCORRECT SITTING POSTURES   If you are feeling tired and unable to assume a healthy sitting posture, do not slouch or slump. This puts excessive strain on your back tissues, causing more damage and pain. Healthier options include:  Using more support, like a lumbar pillow.  Switching tasks to something that requires you to be upright or walking.  Talking a brief walk.  Lying down to rest in a neutral-spine position. PROLONGED STANDING WHILE SLIGHTLY LEANING FORWARD  When completing a task that requires you to lean forward while standing in one place for a long time, place either foot up on a stationary 2-4 inch high object to help maintain the best posture. When both feet are on the ground, the low back tends to lose its slight inward curve. If this curve flattens (or becomes too large), then the back and your other joints will experience too much stress, fatigue more quickly and can cause pain.  CORRECT STANDING POSTURES Proper standing posture should be assumed with all daily activities, even if they only take a few moments, like when brushing your teeth. As in sitting, your ears should fall over your shoulders and your shoulders should fall over your hips. You should keep a slight tension in your abdominal  muscles to brace your spine. Your tailbone should point down to the ground, not behind your body, resulting in an over-extended swayback posture.  INCORRECT STANDING POSTURES  Common incorrect standing postures include a forward head, locked knees and/or an excessive swayback. WALKING Walk with an upright posture. Your ears, shoulders and hips should all line-up. PROLONGED ACTIVITY IN A FLEXED POSITION When completing a task that requires you to bend forward   at your waist or lean over a low surface, try to find a way to stabilize 3 of 4 of your limbs. You can place a hand or elbow on your thigh or rest a knee on the surface you are reaching across. This will provide you more stability so that your muscles do not fatigue as quickly. By keeping your knees relaxed, or slightly bent, you will also reduce stress across your low back. CORRECT LIFTING TECHNIQUES DO :   Assume a wide stance. This will provide you more stability and the opportunity to get as close as possible to the object which you are lifting.  Tense your abdominals to brace your spine; then bend at the knees and hips. Keeping your back locked in a neutral-spine position, lift using your leg muscles. Lift with your legs, keeping your back straight.  Test the weight of unknown objects before attempting to lift them.  Try to keep your elbows locked down at your sides in order get the best strength from your shoulders when carrying an object.  Always ask for help when lifting heavy or awkward objects. INCORRECT LIFTING TECHNIQUES DO NOT:   Lock your knees when lifting, even if it is a small object.  Bend and twist. Pivot at your feet or move your feet when needing to change directions.  Assume that you cannot safely pick up a paperclip without proper posture.   This information is not intended to replace advice given to you by your health care provider. Make sure you discuss any questions you have with your health care provider.     Document Released: 04/30/2005 Document Revised: 09/14/2014 Document Reviewed: 08/12/2008 Else[employer] Interactive Patient Education 2016 [employer]  

## 2016-01-19 ENCOUNTER — Other Ambulatory Visit: Payer: Self-pay | Admitting: *Deleted

## 2016-01-19 DIAGNOSIS — I10 Essential (primary) hypertension: Secondary | ICD-10-CM

## 2016-01-19 MED ORDER — LISINOPRIL 40 MG PO TABS: 40.0000 mg | ORAL_TABLET | Freq: Every day | ORAL | 1 refills | Status: DC

## 2016-01-19 MED ORDER — AMLODIPINE BESYLATE 10 MG PO TABS: 10.0000 mg | ORAL_TABLET | Freq: Every day | ORAL | 1 refills | Status: DC

## 2016-07-20 ENCOUNTER — Other Ambulatory Visit: Payer: Self-pay | Admitting: Nurse Practitioner

## 2016-07-20 DIAGNOSIS — I10 Essential (primary) hypertension: Secondary | ICD-10-CM

## 2016-07-23 NOTE — Telephone Encounter (Signed)
Last refill without being seen 

## 2020-06-14 ENCOUNTER — Encounter: Payer: Self-pay | Admitting: Nurse Practitioner

## 2020-06-14 ENCOUNTER — Ambulatory Visit (INDEPENDENT_AMBULATORY_CARE_PROVIDER_SITE_OTHER): Payer: 59 | Admitting: Nurse Practitioner

## 2020-06-14 DIAGNOSIS — H9213 Otorrhea, bilateral: Secondary | ICD-10-CM

## 2020-06-14 DIAGNOSIS — J440 Chronic obstructive pulmonary disease with acute lower respiratory infection: Secondary | ICD-10-CM | POA: Diagnosis not present

## 2020-06-14 DIAGNOSIS — J209 Acute bronchitis, unspecified: Secondary | ICD-10-CM | POA: Diagnosis not present

## 2020-06-14 MED ORDER — BUDESONIDE-FORMOTEROL FUMARATE 160-4.5 MCG/ACT IN AERO
2.0000 | INHALATION_SPRAY | Freq: Two times a day (BID) | RESPIRATORY_TRACT | 3 refills | Status: DC
Start: 2020-06-14 — End: 2021-05-18

## 2020-06-14 MED ORDER — PREDNISONE 20 MG PO TABS: 40.0000 mg | ORAL_TABLET | Freq: Every day | ORAL | 0 refills | Status: AC

## 2020-06-14 MED ORDER — OFLOXACIN 0.3 % OT SOLN: 5.0000 [drp] | Freq: Every day | OTIC | 0 refills | Status: DC

## 2020-06-14 NOTE — Progress Notes (Signed)
Virtual Visit via telephone Note Due to COVID-19 pandemic this visit was conducted virtually. This visit type was conducted due to national recommendations for restrictions regarding the COVID-19 Pandemic (e.g. social distancing, sheltering in place) in an effort to limit this patient's exposure and mitigate transmission in our community. All issues noted in this document were discussed and addressed.  A physical exam was not performed with this format.  I connected with Philip Ashley on 06/14/20 at 9:00 by telephone and verified that I am speaking with the correct person using two identifiers. Philip Ashley is currently located at home and no one is currently with him during visit. The provider, Mary-Margaret Daphine Deutscher, FNP is located in their office at time of visit.  I discussed the limitations, risks, security and privacy concerns of performing an evaluation and management service by telephone and the availability of in person appointments. I also discussed with the patient that there may be a patient responsible charge related to this service. The patient expressed understanding and agreed to proceed.   History and Present Illness:   Chief Complaint: COPD   HPI Patient thinks he is having a flare up of COPD. He says when he bends over he will start coughing and become sob. He is a smoker(vapping Now) and has a cough anyway. He is on symbicort 80/4.5 and has been using the rescue inhaler 2-3 x a day. Ears have been draining and are painful to touch.   Review of Systems  Constitutional: Negative for chills, diaphoresis, fever and malaise/fatigue.  HENT: Positive for congestion, ear discharge (yellowish and thick) and ear pain. Negative for sinus pain and sore throat.   Respiratory: Positive for cough and sputum production. Negative for shortness of breath.   Neurological: Positive for headaches.  All other systems reviewed and are negative.    Observations/Objective: Alert and  oriented- answers all questions appropriately No distress Voice hoarse Cough dry and tight  Assessment and Plan: Philip Ashley in today with chief complaint of COPD   1. Acute bronchitis with COPD (HCC) Force fluids Stop vaping Increase symbicort to 160/4.5 - budesonide-formoterol (SYMBICORT) 160-4.5 MCG/ACT inhaler; Inhale 2 puffs into the lungs 2 (two) times daily.  Dispense: 1 each; Refill: 3 - predniSONE (DELTASONE) 20 MG tablet; Take 2 tablets (40 mg total) by mouth daily with breakfast for 5 days. 2 po daily for 5 days  Dispense: 10 tablet; Refill: 0  2. Otorrhea of both ears Avoid getting water in ears - ofloxacin (FLOXIN OTIC) 0.3 % OTIC solution; Place 5 drops into both ears daily.  Dispense: 10 mL; Refill: 0    Follow Up Instructions: prn    I discussed the assessment and treatment plan with the patient. The patient was provided an opportunity to ask questions and all were answered. The patient agreed with the plan and demonstrated an understanding of the instructions.   The patient was advised to call back or seek an in-person evaluation if the symptoms worsen or if the condition fails to improve as anticipated.  The above assessment and management plan was discussed with the patient. The patient verbalized understanding of and has agreed to the management plan. Patient is aware to call the clinic if symptoms persist or worsen. Patient is aware when to return to the clinic for a follow-up visit. Patient educated on when it is appropriate to go to the emergency department.   Time call ended:  9:14  I provided 14 minutes of non-face-to-face time during this encounter.  Mary-Margaret Aleksandr Pellow, FNP   

## 2021-05-18 ENCOUNTER — Ambulatory Visit (INDEPENDENT_AMBULATORY_CARE_PROVIDER_SITE_OTHER): Payer: 59 | Admitting: Nurse Practitioner

## 2021-05-18 ENCOUNTER — Encounter: Payer: Self-pay | Admitting: Nurse Practitioner

## 2021-05-18 VITALS — BP 142/88 | HR 82 | Temp 98.2°F | Resp 20 | Ht 69.0 in | Wt 207.0 lb

## 2021-05-18 DIAGNOSIS — E785 Hyperlipidemia, unspecified: Secondary | ICD-10-CM | POA: Diagnosis not present

## 2021-05-18 DIAGNOSIS — J410 Simple chronic bronchitis: Secondary | ICD-10-CM | POA: Diagnosis not present

## 2021-05-18 DIAGNOSIS — Z6829 Body mass index (BMI) 29.0-29.9, adult: Secondary | ICD-10-CM

## 2021-05-18 DIAGNOSIS — F1020 Alcohol dependence, uncomplicated: Secondary | ICD-10-CM | POA: Diagnosis not present

## 2021-05-18 DIAGNOSIS — I10 Essential (primary) hypertension: Secondary | ICD-10-CM

## 2021-05-18 DIAGNOSIS — Z125 Encounter for screening for malignant neoplasm of prostate: Secondary | ICD-10-CM

## 2021-05-18 MED ORDER — PRAVASTATIN SODIUM 40 MG PO TABS: 40.0000 mg | ORAL_TABLET | Freq: Every day | ORAL | 1 refills | Status: DC

## 2021-05-18 MED ORDER — BUDESONIDE-FORMOTEROL FUMARATE 160-4.5 MCG/ACT IN AERO: 2.0000 | INHALATION_SPRAY | Freq: Two times a day (BID) | RESPIRATORY_TRACT | 1 refills | Status: DC

## 2021-05-18 MED ORDER — LISINOPRIL 40 MG PO TABS: 40.0000 mg | ORAL_TABLET | Freq: Every day | ORAL | 1 refills | Status: DC

## 2021-05-18 NOTE — Patient Instructions (Signed)
Electronic Cigarette Information °Electronic cigarettes, or e-cigarettes, are battery-operated devices that deliver nicotine--a very addictive drug--to the body. They come in many shapes, including in the shape of a cigarette, pipe, pen, and even a USB memory stick. E-cigarettes have a cartridge that contains a liquid form of nicotine. When a person uses the device, the liquid heats up. It then becomes a vapor. Inhaling this vapor is called vaping. °Nicotine is thought to increase your risk for certain types of cancer. In addition to nicotine, e-cigarettes may contain other harmful and cancer-causing chemicals, including: °Formaldehyde. °Acetaldehyde. °Heavy metals. °Ultra-fine particles that can get inhaled deep into the lungs. °Chemical colorings and flavorings. °It is not clear how much nicotine you get when vaping, and it is hard to know what chemicals are in the vaping liquids. The health effects of vaping are not completely known, but you should be aware of the possible dangers of using these products. °Some people may use e-cigarettes in order to quit smoking tobacco. However, this has not been proven to work, and the Food and Drug Administration (FDA) has not approved e-cigarettes for this purpose. °How can using electronic cigarettes affect me? °You may be at risk for developing a dangerous lung disease. There are reports of an increasing number of cases involving serious lung problems, and even death, associated with e-cigarette use. Your risk may be even higher if you: °Buy e-cigarettes or vaping oils off the street. °Add any substances to the e-cigarettes that are not intended by the manufacturer. °Vaping may make you crave nicotine. Nicotine does the following: °Changes your blood sugar levels. °Increases your heart rate, blood pressure, and breathing rate. °Increases your risk of developing blood clots (hypercoagulable state) and diabetes. °Increases your risk of gum disease that may lead to losing  teeth. °If you smoke e-cigarettes, you may be more likely to start smoking or to smoke more tobacco cigarettes. °Becoming addicted to nicotine may make your brain more sensitive to other addictive drugs. You may move to other addictive substances. °You may be in danger of overdosing on nicotine. Nicotine poisoning can cause nausea, vomiting, seizures, and trouble breathing. °An e-cigarette may explode and cause fires and burn injuries. °If you are pregnant, the nicotine in e-cigarettes may be harmful to your baby. Nicotine can cause: °Brain or lung problems for your baby. °Your baby to be born too early. °Your baby to be born with a low birth weight. °Vaping has also been linked to decreases in memory and attention span in children and teens. °What actions can I take to stop vaping? °If you can, stop vaping on your own before you become addicted to nicotine. If you need help quitting, ask your health care provider. There are three effective ways to fight nicotine addiction: °Nicotine replacement therapy. Using nicotine gum or a nicotine patch blocks your craving for nicotine. Over time, you can reduce the amount of nicotine you use until you can stop using nicotine completely without having cravings. °Prescription medicines approved to fight nicotine addiction. These stop nicotine cravings or block the effects of nicotine. °Behavioral therapy. This may include: °A self-help smoking cessation program. °Individual or group therapy. °A smoking cessation support group. °There are several national programs to help you quit smoking or vaping. These include: °Text message programs, such as SmokefreeTXT. °Apps for mobile phones, including the free quitSTART app. °Hotlines, such as 1-800-QUIT-NOW (1-800-784-8669). °Where to find support °You can get support at these sites: °U.S. Department of Health and Human Services: https://smokefree.gov °American Lung   Association: www.lung.org °Where to find more information °Learn more  about e-cigarettes from: °National Institute on Drug Abuse: www.drugabuse.gov °Centers for Disease Control and Prevention: www.cdc.gov °Summary °E-cigarettes can cause nicotine addiction. °E-cigarettes are not approved as a way to stop smoking. They are not a risk-free alternative to smoking tobacco. °There are reports of an increasing number of cases involving serious lung problems, and even death, associated with e-cigarette use. °If you can stop vaping on your own, do it before you become addicted to nicotine. If you need help quitting, ask your health care provider. °There are various methods and programs that can help you stop smoking or vaping. °This information is not intended to replace advice given to you by your health care provider. Make sure you discuss any questions you have with your health care provider. °Document Revised: 11/03/2020 Document Reviewed: 07/11/2018 °Elsevier Patient Education © 2022 Elsevier Inc. ° °

## 2021-05-18 NOTE — Progress Notes (Signed)
Subjective:    Patient ID: Philip Ashley, male    DOB: 10/30/1960, 61 y.o.   MRN: [id number]   Chief Complaint: Medical Management of Chronic Issues    HPI:  Philip Ashley is a 61 y.o. who identifies as a male who was assigned male at birth.   Social history: Lives with: his wife Work history: works at [employer] in today for follow up of the following chronic medical issues:  1. Essential hypertension No c/o chest pain, sob or headache. Doe snot check blood pressure at home. He gets very anxious when he comes to the doctor BP Readings from Last 3 Encounters:  05/18/21 (!) 150/94  11/04/15 139/83  02/01/15 (!) 184/103     2. Hyperlipidemia with target LDL less than 100 Does not watch diet and does little to no exercise. Lab Results  Component Value Date   CHOL 181 01/21/2015   HDL 58 01/21/2015   LDLCALC 88 01/21/2015   TRIG 176 (H) 01/21/2015   CHOLHDL 3.1 01/21/2015     3. Simple chronic bronchitis (HCC) Denis cough. Use a vape.  4. Alcoholism ([place]) Has not drank in over 2 years.  5. BMI 29.0-29.9,adult Weight is up 5lbs Wt Readings from Last 3 Encounters:  05/18/21 207 lb (93.9 kg)  11/04/15 202 lb (91.6 kg)  02/01/15 201 lb (91.2 kg)   BMI Readings from Last 3 Encounters:  05/18/21 30.57 kg/m  11/04/15 29.83 kg/m  02/01/15 29.68 kg/m      New complaints: None today  No Known Allergies Outpatient Encounter Medications as of 05/18/2021  Medication Sig   albuterol (VENTOLIN HFA) 108 (90 Base) MCG/ACT inhaler albuterol sulfate HFA 90 mcg/actuation aerosol inhaler  INHALE 2 PUFFS INTO LUNGS EVERY 4 HOURS AS NEEDED.   budesonide-formoterol (SYMBICORT) 160-4.5 MCG/ACT inhaler Inhale 2 puffs into the lungs 2 (two) times daily.   lisinopril (PRINIVIL,ZESTRIL) 40 MG tablet TAKE 1 TABLET ONCE DAILY.   pravastatin (PRAVACHOL) 40 MG tablet TAKE 1 TABLET ONCE DAILY.   [DISCONTINUED] amLODipine (NORVASC) 10 MG tablet Take 1 tablet (10 mg  total) by mouth daily.   [DISCONTINUED] cyclobenzaprine (FLEXERIL) 5 MG tablet Take 1 tablet (5 mg total) by mouth 3 (three) times daily as needed for muscle spasms.   [DISCONTINUED] naproxen (NAPROSYN) 500 MG tablet Take 1 tablet (500 mg total) by mouth 2 (two) times daily with a meal.   [DISCONTINUED] ofloxacin (FLOXIN OTIC) 0.3 % OTIC solution Place 5 drops into both ears daily.   [DISCONTINUED] Omega-3 Fatty Acids (FISH OIL) 1000 MG CAPS Take by mouth.   No facility-administered encounter medications on file as of 05/18/2021.    History reviewed. No pertinent surgical history.  History reviewed. No pertinent family history.    Controlled substance contract: n/a      Review of Systems  Constitutional:  Negative for diaphoresis.  Eyes:  Negative for pain.  Respiratory:  Negative for shortness of breath.   Cardiovascular:  Negative for chest pain, palpitations and leg swelling.  Gastrointestinal:  Negative for abdominal pain.  Endocrine: Negative for polydipsia.  Skin:  Negative for rash.  Neurological:  Negative for dizziness, weakness and headaches.  Hematological:  Does not bruise/bleed easily.  All other systems reviewed and are negative.     Objective:   Physical Exam Vitals and nursing note reviewed.  Constitutional:      Appearance: Normal appearance. He is well-developed.  HENT:     Head: Normocephalic.  Nose: Nose normal.     Mouth/Throat:     Mouth: Mucous membranes are moist.     Pharynx: Oropharynx is clear.  Eyes:     Pupils: Pupils are equal, round, and reactive to light.  Neck:     Thyroid: No thyroid mass or thyromegaly.     Vascular: No carotid bruit or JVD.     Trachea: Phonation normal.  Cardiovascular:     Rate and Rhythm: Normal rate and regular rhythm.  Pulmonary:     Effort: Pulmonary effort is normal. No respiratory distress.     Breath sounds: Normal breath sounds.  Abdominal:     General: Bowel sounds are normal.     Palpations:  Abdomen is soft.     Tenderness: There is no abdominal tenderness.  Musculoskeletal:        General: Normal range of motion.     Cervical back: Normal range of motion and neck supple.  Lymphadenopathy:     Cervical: No cervical adenopathy.  Skin:    General: Skin is warm and dry.  Neurological:     Mental Status: He is alert and oriented to person, place, and time.  Psychiatric:        Behavior: Behavior normal.        Thought Content: Thought content normal.        Judgment: Judgment normal.   BP (!) 142/88    Pulse 82    Temp 98.2 F (36.8 C) (Temporal)    Resp 20    Ht '5\' 9"'  (1.753 m)    Wt 207 lb (93.9 kg)    SpO2 94%    BMI 30.57 kg/m         Assessment & Plan:  Philip Ashley comes in today with chief complaint of Medical Management of Chronic Issues   Diagnosis and orders addressed:  1. Essential hypertension Low sodium diet - lisinopril (ZESTRIL) 40 MG tablet; Take 1 tablet (40 mg total) by mouth daily.  Dispense: 90 tablet; Refill: 1 - CBC with Differential/Platelet - CMP14+EGFR  2. Hyperlipidemia with target LDL less than 100 Lowfat diet - pravastatin (PRAVACHOL) 40 MG tablet; Take 1 tablet (40 mg total) by mouth daily.  Dispense: 90 tablet; Refill: 1 - Lipid panel  3. Simple chronic bronchitis (HCC) Need to stop vaping - budesonide-formoterol (SYMBICORT) 160-4.5 MCG/ACT inhaler; Inhale 2 puffs into the lungs 2 (two) times daily.  Dispense: 3 each; Refill: 1  4. Alcoholism ([place]) Continue to avoid alcohol  5. BMI 29.0-29.9,adult Discussed diet and exercise for person with BMI >25 Will recheck weight in 3-6 months   7. Prostate cancer screening Labs pending - PSA, total and free   Labs pending Health Maintenance reviewed Diet and exercise encouraged  Follow up plan: 6 months   Mary-Margaret Hassell Done, FNP

## 2021-05-19 LAB — CMP14+EGFR
ALT: 32 IU/L (ref 0–44)
AST: 23 IU/L (ref 0–40)
Albumin/Globulin Ratio: 2.8 — ABNORMAL HIGH (ref 1.2–2.2)
Albumin: 4.7 g/dL (ref 3.8–4.8)
Alkaline Phosphatase: 73 IU/L (ref 44–121)
BUN/Creatinine Ratio: 12 (ref 10–24)
BUN: 9 mg/dL (ref 8–27)
Bilirubin Total: 0.6 mg/dL (ref 0.0–1.2)
CO2: 25 mmol/L (ref 20–29)
Calcium: 9.4 mg/dL (ref 8.6–10.2)
Chloride: 100 mmol/L (ref 96–106)
Creatinine, Ser: 0.77 mg/dL (ref 0.76–1.27)
Globulin, Total: 1.7 g/dL (ref 1.5–4.5)
Glucose: 80 mg/dL (ref 70–99)
Potassium: 4.9 mmol/L (ref 3.5–5.2)
Sodium: 139 mmol/L (ref 134–144)
Total Protein: 6.4 g/dL (ref 6.0–8.5)
eGFR: 101 mL/min/{1.73_m2} (ref >59)

## 2021-05-19 LAB — PSA, TOTAL AND FREE
PSA, Free Pct: 20 %
PSA, Free: 0.22 ng/mL
Prostate Specific Ag, Serum: 1.1 ng/mL (ref 0.0–4.0)

## 2021-05-19 LAB — CBC WITH DIFFERENTIAL/PLATELET
Basophils Absolute: 0.1 10*3/uL (ref 0.0–0.2)
Basos: 1 %
EOS (ABSOLUTE): 0.5 10*3/uL — ABNORMAL HIGH (ref 0.0–0.4)
Eos: 7 %
Hematocrit: 46.8 % (ref 37.5–51.0)
Hemoglobin: 15.7 g/dL (ref 13.0–17.7)
Immature Grans (Abs): 0 10*3/uL (ref 0.0–0.1)
Immature Granulocytes: 1 %
Lymphocytes Absolute: 1.3 10*3/uL (ref 0.7–3.1)
Lymphs: 19 %
MCH: 29.7 pg (ref 26.6–33.0)
MCHC: 33.5 g/dL (ref 31.5–35.7)
MCV: 89 fL (ref 79–97)
Monocytes Absolute: 0.5 10*3/uL (ref 0.1–0.9)
Monocytes: 8 %
Neutrophils Absolute: 4.6 10*3/uL (ref 1.4–7.0)
Neutrophils: 64 %
Platelets: 296 10*3/uL (ref 150–450)
RBC: 5.28 x10E6/uL (ref 4.14–5.80)
RDW: 13.2 % (ref 11.6–15.4)
WBC: 7.1 10*3/uL (ref 3.4–10.8)

## 2021-05-19 LAB — LIPID PANEL
Chol/HDL Ratio: 3.2 ratio (ref 0.0–5.0)
Cholesterol, Total: 174 mg/dL (ref 100–199)
HDL: 55 mg/dL (ref >39)
LDL Chol Calc (NIH): 84 mg/dL (ref 0–99)
Triglycerides: 211 mg/dL — ABNORMAL HIGH (ref 0–149)
VLDL Cholesterol Cal: 35 mg/dL (ref 5–40)

## 2021-06-01 ENCOUNTER — Telehealth: Payer: Self-pay | Admitting: Nurse Practitioner

## 2021-06-01 NOTE — Telephone Encounter (Signed)
erroneous

## 2021-06-14 ENCOUNTER — Other Ambulatory Visit: Payer: Self-pay | Admitting: Family Medicine

## 2021-06-14 DIAGNOSIS — J410 Simple chronic bronchitis: Secondary | ICD-10-CM

## 2021-06-14 MED ORDER — ALBUTEROL SULFATE HFA 108 (90 BASE) MCG/ACT IN AERS: INHALATION_SPRAY | RESPIRATORY_TRACT | 3 refills | Status: DC

## 2021-06-14 MED ORDER — BUDESONIDE-FORMOTEROL FUMARATE 160-4.5 MCG/ACT IN AERO: 2.0000 | INHALATION_SPRAY | Freq: Two times a day (BID) | RESPIRATORY_TRACT | 1 refills | Status: DC

## 2021-07-08 ENCOUNTER — Other Ambulatory Visit: Payer: Self-pay | Admitting: Nurse Practitioner

## 2021-07-28 ENCOUNTER — Other Ambulatory Visit: Payer: Self-pay | Admitting: Nurse Practitioner

## 2021-08-15 ENCOUNTER — Encounter: Payer: Self-pay | Admitting: Family Medicine

## 2021-11-07 ENCOUNTER — Other Ambulatory Visit: Payer: Self-pay | Admitting: Nurse Practitioner

## 2021-11-07 DIAGNOSIS — I10 Essential (primary) hypertension: Secondary | ICD-10-CM

## 2021-11-16 ENCOUNTER — Other Ambulatory Visit: Payer: Self-pay | Admitting: Nurse Practitioner

## 2021-11-16 DIAGNOSIS — E785 Hyperlipidemia, unspecified: Secondary | ICD-10-CM

## 2021-11-16 DIAGNOSIS — I10 Essential (primary) hypertension: Secondary | ICD-10-CM

## 2021-11-16 DIAGNOSIS — J410 Simple chronic bronchitis: Secondary | ICD-10-CM

## 2021-11-16 MED ORDER — LISINOPRIL 40 MG PO TABS: 40.0000 mg | ORAL_TABLET | Freq: Every day | ORAL | 0 refills | Status: DC

## 2021-11-16 MED ORDER — BUDESONIDE-FORMOTEROL FUMARATE 160-4.5 MCG/ACT IN AERO: 2.0000 | INHALATION_SPRAY | Freq: Two times a day (BID) | RESPIRATORY_TRACT | 1 refills | Status: DC

## 2021-11-16 MED ORDER — PRAVASTATIN SODIUM 40 MG PO TABS: 40.0000 mg | ORAL_TABLET | Freq: Every day | ORAL | 0 refills | Status: DC

## 2021-11-17 ENCOUNTER — Ambulatory Visit: Payer: 59 | Admitting: Nurse Practitioner

## 2021-11-20 ENCOUNTER — Other Ambulatory Visit: Payer: Self-pay | Admitting: Nurse Practitioner

## 2021-12-14 ENCOUNTER — Encounter: Payer: Self-pay | Admitting: Nurse Practitioner

## 2021-12-14 ENCOUNTER — Ambulatory Visit (INDEPENDENT_AMBULATORY_CARE_PROVIDER_SITE_OTHER): Payer: 59 | Admitting: Nurse Practitioner

## 2021-12-14 ENCOUNTER — Ambulatory Visit (INDEPENDENT_AMBULATORY_CARE_PROVIDER_SITE_OTHER): Payer: 59

## 2021-12-14 VITALS — BP 128/86 | HR 83 | Temp 98.0°F | Resp 20 | Ht 69.0 in | Wt 215.0 lb

## 2021-12-14 DIAGNOSIS — I10 Essential (primary) hypertension: Secondary | ICD-10-CM

## 2021-12-14 DIAGNOSIS — J410 Simple chronic bronchitis: Secondary | ICD-10-CM

## 2021-12-14 DIAGNOSIS — R0602 Shortness of breath: Secondary | ICD-10-CM | POA: Diagnosis not present

## 2021-12-14 DIAGNOSIS — Z6829 Body mass index (BMI) 29.0-29.9, adult: Secondary | ICD-10-CM

## 2021-12-14 DIAGNOSIS — F1020 Alcohol dependence, uncomplicated: Secondary | ICD-10-CM

## 2021-12-14 DIAGNOSIS — E785 Hyperlipidemia, unspecified: Secondary | ICD-10-CM | POA: Diagnosis not present

## 2021-12-14 DIAGNOSIS — S161XXA Strain of muscle, fascia and tendon at neck level, initial encounter: Secondary | ICD-10-CM

## 2021-12-14 MED ORDER — PRAVASTATIN SODIUM 40 MG PO TABS: 40.0000 mg | ORAL_TABLET | Freq: Every day | ORAL | 1 refills | Status: DC

## 2021-12-14 MED ORDER — DICLOFENAC SODIUM 1 % EX GEL: 4.0000 g | Freq: Four times a day (QID) | CUTANEOUS | 1 refills | Status: DC

## 2021-12-14 MED ORDER — LISINOPRIL 40 MG PO TABS: 40.0000 mg | ORAL_TABLET | Freq: Every day | ORAL | 1 refills | Status: DC

## 2021-12-14 MED ORDER — IPRATROPIUM-ALBUTEROL 0.5-2.5 (3) MG/3ML IN SOLN: 3.0000 mL | Freq: Four times a day (QID) | RESPIRATORY_TRACT | 1 refills | Status: DC | PRN

## 2021-12-14 MED ORDER — ALBUTEROL SULFATE HFA 108 (90 BASE) MCG/ACT IN AERS: INHALATION_SPRAY | RESPIRATORY_TRACT | 1 refills | Status: DC

## 2021-12-14 NOTE — Progress Notes (Signed)
Subjective:    Patient ID: Philip Ashley, male    DOB: [birth year], 61 y.o.   MRN: [id number]  Chief Complaint: medical management of chronic issues     HPI:  Philip Ashley is a 61 y.o. who identifies as a male who was assigned male at birth.   Social history: Lives with: wife Work history: Marine scientist   Comes in today for follow up of the following chronic medical issues:  1. Essential hypertension No c/o chest pain, sob or headache. Does not check blood pressure at home. BP Readings from Last 3 Encounters:  05/18/21 (!) 142/88  11/04/15 139/83  02/01/15 (!) 184/103     2. Hyperlipidemia with target LDL less than 100 Does not really watch diet and does nom dedicated exercise. Lab Results  Component Value Date   CHOL 174 05/18/2021   HDL 55 05/18/2021   LDLCALC 84 05/18/2021   TRIG 211 (H) 05/18/2021   CHOLHDL 3.2 05/18/2021     3. Simple chronic bronchitis ([place]) Is on symbicort daily. Has chronic cough an dgets sob at times. Wanys a neb machine to use at home.   4. Alcoholism ([place]) Has not had drink in over 4 years  5. BMI 29.0-29.9,adult No recent weight changes Wt Readings from Last 3 Encounters:  12/14/21 215 lb (97.5 kg)  05/18/21 207 lb (93.9 kg)  11/04/15 202 lb (91.6 kg)   BMI Readings from Last 3 Encounters:  12/14/21 31.75 kg/m  05/18/21 30.57 kg/m  11/04/15 29.83 kg/m      New complaints: Left neck pain  started 2 weeks ago. Does not recall a injury. Rates pain 7/10. Pain is intermittent and achy. He has not tried anything at home.  No Known Allergies Outpatient Encounter Medications as of 12/14/2021  Medication Sig   albuterol (VENTOLIN HFA) 108 (90 Base) MCG/ACT inhaler INHALE 2 PUFFS INTO LUNGS EVERY 4 HOURS AS NEEDED.   budesonide-formoterol (SYMBICORT) 160-4.5 MCG/ACT inhaler Inhale 2 puffs into the lungs 2 (two) times daily.   lisinopril (ZESTRIL) 40 MG tablet Take 1 tablet (40 mg total) by mouth daily.   pravastatin  (PRAVACHOL) 40 MG tablet Take 1 tablet (40 mg total) by mouth daily.   No facility-administered encounter medications on file as of 12/14/2021.    No past surgical history on file.  No family history on file.    Controlled substance contract: n/a     Review of Systems  Constitutional:  Negative for diaphoresis.  Eyes:  Negative for pain.  Respiratory:  Positive for cough and shortness of breath.   Cardiovascular:  Negative for chest pain, palpitations and leg swelling.  Gastrointestinal:  Negative for abdominal pain.  Endocrine: Negative for polydipsia.  Musculoskeletal:  Positive for neck pain (left).  Skin:  Negative for rash.  Neurological:  Negative for dizziness, weakness and headaches.  Hematological:  Does not bruise/bleed easily.  All other systems reviewed and are negative.      Objective:   Physical Exam Vitals and nursing note reviewed.  Constitutional:      Appearance: Normal appearance. He is well-developed.  HENT:     Head: Normocephalic.     Nose: Nose normal.     Mouth/Throat:     Mouth: Mucous membranes are moist.     Pharynx: Oropharynx is clear.  Eyes:     Pupils: Pupils are equal, round, and reactive to light.  Neck:     Thyroid: No thyroid mass or thyromegaly.     Vascular:  No carotid bruit or JVD.     Trachea: Phonation normal.  Cardiovascular:     Rate and Rhythm: Normal rate and regular rhythm.  Pulmonary:     Effort: Pulmonary effort is normal. No respiratory distress.     Breath sounds: Normal breath sounds.  Abdominal:     General: Bowel sounds are normal.     Palpations: Abdomen is soft.     Tenderness: There is no abdominal tenderness.  Musculoskeletal:        General: Normal range of motion.     Cervical back: Normal range of motion and neck supple.     Comments: FROM of cervical spine with pain  on tilting neck to left Grips equal bil  Lymphadenopathy:     Cervical: No cervical adenopathy.  Skin:    General: Skin is warm  and dry.  Neurological:     Mental Status: He is alert and oriented to person, place, and time.  Psychiatric:        Behavior: Behavior normal.        Thought Content: Thought content normal.        Judgment: Judgment normal.    BP 128/86   Pulse 83   Temp 98 F (36.7 C) (Temporal)   Resp 20   Ht '5\' 9"'  (1.753 m)   Wt 215 lb (97.5 kg)   SpO2 95%   BMI 31.75 kg/m   Chest xray- chronic bronchitic changes-Preliminary reading by Ronnald Collum, FNP  [facility]        Assessment & Plan:   Philip Ashley comes in today with chief complaint of Medical Management of Chronic Issues   Diagnosis and orders addressed:  1. Essential hypertension Low sodium diet - lisinopril (ZESTRIL) 40 MG tablet; Take 1 tablet (40 mg total) by mouth daily.  Dispense: 90 tablet; Refill: 1 - CBC with Differential/Platelet - CMP14+EGFR  2. Hyperlipidemia with target LDL less than 100 Low fat diet - pravastatin (PRAVACHOL) 40 MG tablet; Take 1 tablet (40 mg total) by mouth daily.  Dispense: 90 tablet; Refill: 1 - Lipid panel  3. Simple chronic bronchitis (HCC) Add nebulizer as needed - For home use only DME Nebulizer machine - ipratropium-albuterol (DUONEB) 0.5-2.5 (3) MG/3ML SOLN; Take 3 mLs by nebulization every 6 (six) hours as needed.  Dispense: 360 mL; Refill: 1 - albuterol (VENTOLIN HFA) 108 (90 Base) MCG/ACT inhaler; 2 puff prn  Dispense: 18 g; Refill: 1  4. Alcoholism ([place]) Continue to avoid alcohol  5. BMI 29.0-29.9,adult Discussed diet and exercise for person with BMI >25 Will recheck weight in 3-6 months   6. SOB (shortness of breath) - DG Chest 2 View  7. Strain of neck muscle, initial encounter Moist heat to neck stretches - diclofenac Sodium (VOLTAREN) 1 % GEL; Apply 4 g topically 4 (four) times daily.  Dispense: 350 g; Refill: 1   Labs pending Health Maintenance reviewed Diet and exercise encouraged  Follow up plan: 6 months   Mary-Margaret Hassell Done, FNP

## 2021-12-14 NOTE — Patient Instructions (Signed)
Cervical Sprain A cervical sprain is also called a neck sprain. It is a stretch or tear in one or more ligaments in the neck. Ligaments are tissues that connect bones to each other. Neck sprains can be mild, bad, or very bad. A very bad sprain in the neck can cause the bones in the neck to be unstable. This can damage the spinal cord. It can also cause serious problems in the brain, spinal cord, and nerves (nervous system). Most neck sprains heal in 4-6 weeks. It can take more or less time depending on: What caused the injury. The amount of injury. What are the causes? Neck sprains may be caused by trauma, such as: An injury from an accident in a vehicle such as a car or boat. A fall. The head and neck being moved front to back or side to side all of a sudden (whiplash injury). Mild neck sprains may be caused by wear and tear over time. What increases the risk? The following factors may make you more likely to develop this condition: Taking part in activities that put you at high risk of hurting your neck. These include: Contact sports. Car racing. Gymnastics. Diving. Taking risks when driving or riding in a vehicle such as a car or boat. Arthritis caused by wear and tear of the joints in the spine. The neck not being very strong or flexible. Having had a neck injury in the past. Poor posture. Spending a lot of time in certain positions that put stress on the neck. This may be from sitting at a computer for a long time. What are the signs or symptoms? Symptoms of this condition include: Your neck, shoulders, or upper back feeling: Painful or sore. Stiff. Tender. Swollen. Hot, or like it is burning. Sudden tightening of neck muscles (spasms). Not being able to move the neck very much. Headache. Feeling dizzy. Feeling like you may vomit, or vomiting. Having a hand or arm that: Feels weak. Loses feeling (feels numb). Tingles. You may get symptoms right away after injury, or you  may get them over a few days. In some cases, symptoms may go away with treatment and come back over time. How is this treated? This condition is treated by: Resting your neck. Icing the part of your neck that is hurt. Doing exercises to restore movement and strength to your neck (physical therapy). If there is no swelling, you may use heat therapy 2-3 days after the injury took place. If your injury is very bad, treatment may also include: Keeping your neck in place for a length of time. This may be done using: A neck collar. This supports your chin and the back of your head. A cervical traction device. This is a sling that holds up your head. The sling removes weight and pressure from your neck. It may also help to relieve pain. Medicines that help with: Pain. Irritation and swelling (inflammation). Medicines that help to relax your muscles (muscle relaxants). Surgery. This is rare. Follow these instructions at home: Medicines  Take over-the-counter and prescription medicines only as told by your doctor. Ask your doctor if the medicine prescribed to you: Requires you to avoid driving or using heavy machinery. Can cause trouble pooping (constipation). You may need to take these actions to prevent or treat trouble pooping: Drink enough fluid to keep your pee (urine) pale yellow. Take over-the-counter or prescription medicines. Eat foods that are high in fiber. These include beans, whole grains, and fresh fruits and vegetables. Limit   foods that are high in fat and sugar. These include fried or sweet foods. If you have a neck collar: Wear it as told by your doctor. Do not take it off unless told. Ask your doctor before adjusting your collar. If you have long hair, keep it outside of the collar. Ask your doctor if you may take off the collar for cleaning and bathing. If you may take off the collar: Follow instructions about how to take it off safely. Clean it by hand with mild soap and  water. Let it air-dry fully. If your collar has pads that you can take out: Take the pads out every 1-2 days. Wash them by hand with soap and water. Let the pads air-dry fully before you put them back in the collar. Tell your doctor if your skin under the collar has irritation or sores. Managing pain, stiffness, and swelling     Use a cervical traction device, if told by your doctor. If told, put ice on the affected area. To do this: Put ice in a plastic bag. Place a towel between your skin and the bag. Leave the ice on for 20 minutes, 2-3 times a day. If told, put heat on the affected area. Do this before exercise or as often as told by your doctor. Use the heat source that your doctor recommends, such as a moist heat pack or a heating pad. Place a towel between your skin and the heat source. Leave the heat on for 20-30 minutes. Take the heat off if your skin turns bright red. This is very important if you cannot feel pain, heat, or cold. You may have a greater risk of getting burned. Activity Do not drive while wearing a neck collar. If you do not have a neck collar, ask if it is safe to drive while your neck heals. Do not lift anything that is heavier than 10 lb (4.5 kg), or the limit that you are told, until your doctor tells you that it is safe. Rest as told by your doctor. Do exercises as told by your doctor or physical therapist. Return to your normal activities as told by your doctor. Avoid positions and activities that make you feel worse. Ask your doctor what activities are safe for you. General instructions Do not use any products that contain nicotine or tobacco, such as cigarettes, e-cigarettes, and chewing tobacco. These can delay healing. If you need help quitting, ask your doctor. Keep all follow-up visits as told by your doctor or physical therapist. This is important. How is this prevented? To prevent a neck sprain from happening again: Practice good posture. Adjust  your workstation to help you do this. Exercise regularly as told by your doctor or physical therapist. Avoid activities that are risky or may cause a neck sprain. Contact a doctor if: Your symptoms get worse. Your symptoms do not get better after 2 weeks of treatment. Your pain gets worse. Medicine does not help your pain. You have new symptoms that you cannot explain. Your neck collar gives you sores on your skin or bothers your skin. Get help right away if: You have very bad pain. You get any of the following in any part of your body: Loss of feeling. Tingling. Weakness. You cannot move a part of your body. You have neck pain and either of these: Very bad dizziness. A very bad headache. Summary A cervical sprain is also called a neck sprain. It is a stretch or tear in one or more   ligaments in the neck. Ligaments are tissues that connect bones. Neck sprains may be caused by trauma, such as an injury or a fall. You may get symptoms right away after injury, or you may get them over a few days. Neck sprains may be treated with rest, heat, ice, medicines, exercise, and surgery. This information is not intended to replace advice given to you by your health care provider. Make sure you discuss any questions you have with your health care provider. Document Revised: 08/07/2021 Document Reviewed: 01/07/2019 Elsevier Patient Education  2023 [employer] Inc.  

## 2021-12-15 LAB — CMP14+EGFR
ALT: 32 IU/L (ref 0–44)
AST: 24 IU/L (ref 0–40)
Albumin/Globulin Ratio: 2.2 (ref 1.2–2.2)
Albumin: 4.9 g/dL (ref 3.9–4.9)
Alkaline Phosphatase: 67 IU/L (ref 44–121)
BUN/Creatinine Ratio: 11 (ref 10–24)
BUN: 8 mg/dL (ref 8–27)
Bilirubin Total: 0.5 mg/dL (ref 0.0–1.2)
CO2: 26 mmol/L (ref 20–29)
Calcium: 9.8 mg/dL (ref 8.6–10.2)
Chloride: 97 mmol/L (ref 96–106)
Creatinine, Ser: 0.76 mg/dL (ref 0.76–1.27)
Globulin, Total: 2.2 g/dL (ref 1.5–4.5)
Glucose: 78 mg/dL (ref 70–99)
Potassium: 5.4 mmol/L — ABNORMAL HIGH (ref 3.5–5.2)
Sodium: 138 mmol/L (ref 134–144)
Total Protein: 7.1 g/dL (ref 6.0–8.5)
eGFR: 101 mL/min/{1.73_m2} (ref >59)

## 2021-12-15 LAB — LIPID PANEL
Chol/HDL Ratio: 2.7 ratio (ref 0.0–5.0)
Cholesterol, Total: 178 mg/dL (ref 100–199)
HDL: 66 mg/dL (ref >39)
LDL Chol Calc (NIH): 83 mg/dL (ref 0–99)
Triglycerides: 170 mg/dL — ABNORMAL HIGH (ref 0–149)
VLDL Cholesterol Cal: 29 mg/dL (ref 5–40)

## 2021-12-15 LAB — CBC WITH DIFFERENTIAL/PLATELET
Basophils Absolute: 0.1 10*3/uL (ref 0.0–0.2)
Basos: 1 %
EOS (ABSOLUTE): 0.6 10*3/uL — ABNORMAL HIGH (ref 0.0–0.4)
Eos: 8 %
Hematocrit: 44.3 % (ref 37.5–51.0)
Hemoglobin: 15.1 g/dL (ref 13.0–17.7)
Immature Grans (Abs): 0.1 10*3/uL (ref 0.0–0.1)
Immature Granulocytes: 1 %
Lymphocytes Absolute: 1.4 10*3/uL (ref 0.7–3.1)
Lymphs: 17 %
MCH: 30.3 pg (ref 26.6–33.0)
MCHC: 34.1 g/dL (ref 31.5–35.7)
MCV: 89 fL (ref 79–97)
Monocytes Absolute: 0.7 10*3/uL (ref 0.1–0.9)
Monocytes: 9 %
Neutrophils Absolute: 5.3 10*3/uL (ref 1.4–7.0)
Neutrophils: 64 %
Platelets: 293 10*3/uL (ref 150–450)
RBC: 4.99 x10E6/uL (ref 4.14–5.80)
RDW: 13.7 % (ref 11.6–15.4)
WBC: 8.2 10*3/uL (ref 3.4–10.8)

## 2022-01-22 ENCOUNTER — Other Ambulatory Visit: Payer: Self-pay | Admitting: Nurse Practitioner

## 2022-01-22 DIAGNOSIS — J410 Simple chronic bronchitis: Secondary | ICD-10-CM

## 2022-02-15 ENCOUNTER — Other Ambulatory Visit: Payer: Self-pay | Admitting: Nurse Practitioner

## 2022-02-15 DIAGNOSIS — J410 Simple chronic bronchitis: Secondary | ICD-10-CM

## 2022-03-10 ENCOUNTER — Other Ambulatory Visit: Payer: Self-pay | Admitting: Nurse Practitioner

## 2022-03-10 DIAGNOSIS — J410 Simple chronic bronchitis: Secondary | ICD-10-CM

## 2022-06-07 ENCOUNTER — Other Ambulatory Visit: Payer: Self-pay | Admitting: Nurse Practitioner

## 2022-06-07 DIAGNOSIS — I10 Essential (primary) hypertension: Secondary | ICD-10-CM

## 2022-06-11 ENCOUNTER — Encounter: Payer: Self-pay | Admitting: Nurse Practitioner

## 2022-06-11 ENCOUNTER — Telehealth (INDEPENDENT_AMBULATORY_CARE_PROVIDER_SITE_OTHER): Payer: 59 | Admitting: Nurse Practitioner

## 2022-06-11 DIAGNOSIS — J00 Acute nasopharyngitis [common cold]: Secondary | ICD-10-CM | POA: Diagnosis not present

## 2022-06-11 NOTE — Progress Notes (Signed)
Virtual Visit Consent   Philip Ashley, you are scheduled for a virtual visit with Mary-Margaret Hassell Done, [place], a [facility] provider, today.     Just as with appointments in the office, your consent must be obtained to participate.  Your consent will be active for this visit and any virtual visit you may have with one of our providers in the next 365 days.     If you have a MyChart account, a copy of this consent can be sent to you electronically.  All virtual visits are billed to your insurance company just like a traditional visit in the office.    As this is a virtual visit, video technology does not allow for your provider to perform a traditional examination.  This may limit your provider's ability to fully assess your condition.  If your provider identifies any concerns that need to be evaluated in person or the need to arrange testing (such as labs, EKG, etc.), we will make arrangements to do so.     Although advances in technology are sophisticated, we cannot ensure that it will always work on either your end or our end.  If the connection with a video visit is poor, the visit may have to be switched to a telephone visit.  With either a video or telephone visit, we are not always able to ensure that we have a secure connection.     I need to obtain your verbal consent now.   Are you willing to proceed with your visit today? YES   Philip Ashley has provided verbal consent on 06/11/2022 for a virtual visit (video or telephone).   Mary-Margaret Hassell Done, FNP   Date: 06/11/2022 2:02 PM   Virtual Visit via Video Note   I, Mary-Margaret Hassell Done, connected with Philip Ashley ([id number], September 09, 1960) on 06/11/22 at  2:00 PM EST by a video-enabled telemedicine application and verified that I am speaking with the correct person using two identifiers.  Location: Patient: Virtual Visit Location Patient: Home Provider: Virtual Visit Location Provider: Mobile   I discussed the limitations of  evaluation and management by telemedicine and the availability of in person appointments. The patient expressed understanding and agreed to proceed.    History of Present Illness: Philip Ashley is a 62 y.o. who identifies as a male who was assigned male at birth, and is being seen today for uri .  HPI: URI  This is a new problem. The current episode started in the past 7 days. The problem has been waxing and waning. There has been no fever. Associated symptoms include congestion, coughing and rhinorrhea. Pertinent negatives include no sinus pain. He has tried acetaminophen for the symptoms. The treatment provided mild relief.    Review of Systems  HENT:  Positive for congestion and rhinorrhea. Negative for sinus pain.   Respiratory:  Positive for cough.     Problems:  Patient Active Problem List   Diagnosis Date Noted   BMI 29.0-29.9,adult 01/21/2015   Essential hypertension 01/21/2015   COPD (chronic obstructive pulmonary disease) ([place]) 06/11/2014   Hyperlipidemia with target LDL less than 100 06/11/2014   Alcoholism ([place]) 06/11/2014    Allergies: No Known Allergies Medications:  Current Outpatient Medications:    albuterol (VENTOLIN HFA) 108 (90 Base) MCG/ACT inhaler, INHALE 2 PUFFS INTO LUNGS EVERY 4 HOURS AS NEEDED., Disp: 8.5 g, Rfl: 3   budesonide-formoterol (SYMBICORT) 160-4.5 MCG/ACT inhaler, inhale 2 puffs into the lungs TWICE DAILY., Disp: 10.2 g, Rfl: 1   diclofenac Sodium (  VOLTAREN) 1 % GEL, Apply 4 g topically 4 (four) times daily., Disp: 350 g, Rfl: 1   ipratropium-albuterol (DUONEB) 0.5-2.5 (3) MG/3ML SOLN, Take 3 mLs by nebulization every 6 (six) hours as needed., Disp: 360 mL, Rfl: 1   lisinopril (ZESTRIL) 40 MG tablet, TAKE ONE TABLET BY MOUTH DAILY., Disp: 90 tablet, Rfl: 0   pravastatin (PRAVACHOL) 40 MG tablet, Take 1 tablet (40 mg total) by mouth daily., Disp: 90 tablet, Rfl: 1  Observations/Objective: Patient is well-developed, well-nourished in no acute  distress.  Resting comfortably  at home.  Head is normocephalic, atraumatic.  No labored breathing.  Speech is clear and coherent with logical content.  Patient is alert and oriented at baseline.  Raspy voice Dry cough during visit  Assessment and Plan:  Philip Ashley in today with chief complaint of No chief complaint on file.   1. Acute nasopharyngitis 1. Take meds as prescribed 2. Use a cool mist humidifier especially during the winter months and when heat has been humid. 3. Use saline nose sprays frequently 4. Saline irrigations of the nose can be very helpful if done frequently.  * 4X daily for 1 week*  * Use of a nettie pot can be helpful with this. Follow directions with this* 5. Drink plenty of fluids 6. Keep thermostat turn down low 7.For any cough or congestion- delsym OTC 8. For fever or aces or pains- take tylenol or ibuprofen appropriate for age and weight.  * for fevers greater than 101 orally you may alternate ibuprofen and tylenol every  3 hours.       Follow Up Instructions: I discussed the assessment and treatment plan with the patient. The patient was provided an opportunity to ask questions and all were answered. The patient agreed with the plan and demonstrated an understanding of the instructions.  A copy of instructions were sent to the patient via MyChart.  The patient was advised to call back or seek an in-person evaluation if the symptoms worsen or if the condition fails to improve as anticipated.  Time:  I spent 10 minutes with the patient via telehealth technology discussing the above problems/concerns.    Mary-Margaret Hassell Done, FNP

## 2022-06-11 NOTE — Patient Instructions (Signed)
1. Take meds as prescribed 2. Use a cool mist humidifier especially during the winter months and when heat has been humid. 3. Use saline nose sprays frequently 4. Saline irrigations of the nose can be very helpful if done frequently.  * 4X daily for 1 week*  * Use of a nettie pot can be helpful with this. Follow directions with this* 5. Drink plenty of fluids 6. Keep thermostat turn down low 7.For any cough or congestion- delsym or mucinex 8. For fever or aces or pains- take tylenol or ibuprofen appropriate for age and weight.  * for fevers greater than 101 orally you may alternate ibuprofen and tylenol every  3 hours.    

## 2022-06-17 ENCOUNTER — Other Ambulatory Visit: Payer: Self-pay | Admitting: Nurse Practitioner

## 2022-06-17 DIAGNOSIS — E785 Hyperlipidemia, unspecified: Secondary | ICD-10-CM

## 2022-06-18 ENCOUNTER — Ambulatory Visit: Payer: 59 | Admitting: Nurse Practitioner

## 2022-06-26 ENCOUNTER — Ambulatory Visit (INDEPENDENT_AMBULATORY_CARE_PROVIDER_SITE_OTHER): Payer: 59 | Admitting: Nurse Practitioner

## 2022-06-26 ENCOUNTER — Encounter: Payer: Self-pay | Admitting: Nurse Practitioner

## 2022-06-26 VITALS — BP 131/85 | HR 80 | Temp 97.4°F | Resp 20 | Ht 69.0 in | Wt 212.0 lb

## 2022-06-26 DIAGNOSIS — E785 Hyperlipidemia, unspecified: Secondary | ICD-10-CM | POA: Diagnosis not present

## 2022-06-26 DIAGNOSIS — J410 Simple chronic bronchitis: Secondary | ICD-10-CM | POA: Diagnosis not present

## 2022-06-26 DIAGNOSIS — F1020 Alcohol dependence, uncomplicated: Secondary | ICD-10-CM

## 2022-06-26 DIAGNOSIS — I10 Essential (primary) hypertension: Secondary | ICD-10-CM | POA: Diagnosis not present

## 2022-06-26 DIAGNOSIS — Z125 Encounter for screening for malignant neoplasm of prostate: Secondary | ICD-10-CM

## 2022-06-26 DIAGNOSIS — Z6829 Body mass index (BMI) 29.0-29.9, adult: Secondary | ICD-10-CM

## 2022-06-26 MED ORDER — LISINOPRIL 40 MG PO TABS: 40.0000 mg | ORAL_TABLET | Freq: Every day | ORAL | 1 refills | Status: DC

## 2022-06-26 MED ORDER — BUDESONIDE-FORMOTEROL FUMARATE 160-4.5 MCG/ACT IN AERO: 2.0000 | INHALATION_SPRAY | Freq: Two times a day (BID) | RESPIRATORY_TRACT | 1 refills | Status: DC

## 2022-06-26 MED ORDER — PRAVASTATIN SODIUM 40 MG PO TABS: 40.0000 mg | ORAL_TABLET | Freq: Every day | ORAL | 1 refills | Status: DC

## 2022-06-26 NOTE — Progress Notes (Signed)
Subjective:    Patient ID: Philip Ashley, male    DOB: [birth year], 62 y.o.   MRN: [id number]   Chief Complaint: No chief complaint on file.    HPI:  Philip Ashley is a 62 y.o. who identifies as a male who was assigned male at birth.   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 Essential hypertension No c/o chest pain, sob or headache. Does not check blood pressure at ome. BP Readings from Last 3 Encounters:  12/14/21 128/86  05/18/21 (!) 142/88  11/04/15 139/83     2. Hyperlipidemia with target LDL less than 100 Does not watch diet and does no dedicated exercise. Lab Results  Component Value Date   CHOL 178 12/14/2021   HDL 66 12/14/2021   LDLCALC 83 12/14/2021   TRIG 170 (H) 12/14/2021   CHOLHDL 2.7 12/14/2021     3. Simple chronic bronchitis (HCC) Uses symbicort daily.  He vapes daily  4. Alcoholism ([place]) Has been sober for 6 years  5. BMI 29.0-29.9,adult No recent weight changes Wt Readings from Last 3 Encounters:  06/26/22 212 lb (96.2 kg)  12/14/21 215 lb (97.5 kg)  05/18/21 207 lb (93.9 kg)   BMI Readings from Last 3 Encounters:  06/26/22 31.31 kg/m  12/14/21 31.75 kg/m  05/18/21 30.57 kg/m      New complaints: None today  No Known Allergies Outpatient Encounter Medications as of 06/26/2022  Medication Sig   albuterol (VENTOLIN HFA) 108 (90 Base) MCG/ACT inhaler INHALE 2 PUFFS INTO LUNGS EVERY 4 HOURS AS NEEDED.   budesonide-formoterol (SYMBICORT) 160-4.5 MCG/ACT inhaler inhale 2 puffs into the lungs TWICE DAILY.   diclofenac Sodium (VOLTAREN) 1 % GEL Apply 4 g topically 4 (four) times daily.   ipratropium-albuterol (DUONEB) 0.5-2.5 (3) MG/3ML SOLN Take 3 mLs by nebulization every 6 (six) hours as needed.   lisinopril (ZESTRIL) 40 MG tablet TAKE ONE TABLET BY MOUTH DAILY.   pravastatin (PRAVACHOL) 40 MG tablet TAKE ONE TABLET BY MOUTH  DAILY.   No facility-administered encounter medications on file as of 06/26/2022.    No past surgical history on file.  No family history on file.    Controlled substance contract: n/a     Review of Systems  Constitutional:  Negative for diaphoresis.  Eyes:  Negative for pain.  Respiratory:  Negative for shortness of breath.   Cardiovascular:  Negative for chest pain, palpitations and leg swelling.  Gastrointestinal:  Negative for abdominal pain.  Endocrine: Negative for polydipsia.  Skin:  Negative for rash.  Neurological:  Negative for dizziness, weakness and headaches.  Hematological:  Does not bruise/bleed easily.  All other systems reviewed and are negative.      Objective:   Physical Exam Vitals and nursing note reviewed.  Constitutional:      Appearance: Normal appearance. He is well-developed.  HENT:     Head: Normocephalic.     Nose: Nose normal.     Mouth/Throat:     Mouth: Mucous membranes are moist.     Pharynx: Oropharynx is clear.  Eyes:     Pupils: Pupils are equal, round, and reactive to light.  Neck:     Thyroid: No thyroid mass or thyromegaly.     Vascular: No carotid bruit or JVD.     Trachea: Phonation normal.  Cardiovascular:     Rate and Rhythm: Normal rate and regular rhythm.  Pulmonary:     Effort: Pulmonary effort is normal. No respiratory distress.     Breath sounds: Normal breath sounds.  Abdominal:     General: Bowel sounds are normal.     Palpations: Abdomen is soft.     Tenderness: There is no abdominal tenderness.  Musculoskeletal:        General: Normal range of motion.     Cervical back: Normal range of motion and neck supple.  Lymphadenopathy:     Cervical: No cervical adenopathy.  Skin:    General: Skin is warm and dry.  Neurological:     Mental Status: He is alert and oriented to person, place, and time.  Psychiatric:        Behavior: Behavior normal.        Thought Content: Thought content normal.        Judgment:  Judgment normal.    BP 131/85   Pulse 80   Temp (!) 97.4 F (36.3 C) (Temporal)   Resp 20   Ht 5' 9"$  (1.753 m)   Wt 212 lb (96.2 kg)   SpO2 95%   BMI 31.31 kg/m         Assessment & Plan:   Philip Ashley comes in today with chief complaint of No chief complaint on file.   Diagnosis and orders addressed:  1. Essential hypertension Low sodium diet - CBC with Differential/Platelet - CMP14+EGFR - lisinopril (ZESTRIL) 40 MG tablet; Take 1 tablet (40 mg total) by mouth daily.  Dispense: 90 tablet; Refill: 1  2. Hyperlipidemia with target LDL less than 100 Low fat diet - Lipid panel - pravastatin (PRAVACHOL) 40 MG tablet; Take 1 tablet (40 mg total) by mouth daily.  Dispense: 90 tablet; Refill: 1  3. Simple chronic bronchitis (HCC) Stop vaping - budesonide-formoterol (SYMBICORT) 160-4.5 MCG/ACT inhaler; Inhale 2 puffs into the lungs 2 (two) times daily.  Dispense: 10.2 g; Refill: 1  4. Alcoholism ([place]) Avoid alcohol  5. BMI 29.0-29.9,adult Discussed diet and exercise for person with BMI >25 Will recheck weight in 3-6 months   6. Prostate cancer screening Labs pending - PSA, total and free   Labs pending Health Maintenance reviewed Diet and exercise encouraged  Follow up plan: 6 months   Mary-Margaret Hassell Done, FNP

## 2022-06-27 LAB — CMP14+EGFR
ALT: 30 IU/L (ref 0–44)
AST: 22 IU/L (ref 0–40)
Albumin/Globulin Ratio: 2.3 — ABNORMAL HIGH (ref 1.2–2.2)
Albumin: 4.8 g/dL (ref 3.9–4.9)
Alkaline Phosphatase: 66 IU/L (ref 44–121)
BUN/Creatinine Ratio: 11 (ref 10–24)
BUN: 9 mg/dL (ref 8–27)
Bilirubin Total: 0.5 mg/dL (ref 0.0–1.2)
CO2: 25 mmol/L (ref 20–29)
Calcium: 9.7 mg/dL (ref 8.6–10.2)
Chloride: 98 mmol/L (ref 96–106)
Creatinine, Ser: 0.84 mg/dL (ref 0.76–1.27)
Globulin, Total: 2.1 g/dL (ref 1.5–4.5)
Glucose: 75 mg/dL (ref 70–99)
Potassium: 4.7 mmol/L (ref 3.5–5.2)
Sodium: 139 mmol/L (ref 134–144)
Total Protein: 6.9 g/dL (ref 6.0–8.5)
eGFR: 97 mL/min/{1.73_m2} (ref >59)

## 2022-06-27 LAB — CBC WITH DIFFERENTIAL/PLATELET
Basophils Absolute: 0.1 10*3/uL (ref 0.0–0.2)
Basos: 1 %
EOS (ABSOLUTE): 0.4 10*3/uL (ref 0.0–0.4)
Eos: 6 %
Hematocrit: 45.8 % (ref 37.5–51.0)
Hemoglobin: 15.4 g/dL (ref 13.0–17.7)
Immature Grans (Abs): 0 10*3/uL (ref 0.0–0.1)
Immature Granulocytes: 0 %
Lymphocytes Absolute: 1.4 10*3/uL (ref 0.7–3.1)
Lymphs: 21 %
MCH: 29.7 pg (ref 26.6–33.0)
MCHC: 33.6 g/dL (ref 31.5–35.7)
MCV: 88 fL (ref 79–97)
Monocytes Absolute: 0.7 10*3/uL (ref 0.1–0.9)
Monocytes: 10 %
Neutrophils Absolute: 4.2 10*3/uL (ref 1.4–7.0)
Neutrophils: 62 %
Platelets: 314 10*3/uL (ref 150–450)
RBC: 5.18 x10E6/uL (ref 4.14–5.80)
RDW: 13.5 % (ref 11.6–15.4)
WBC: 6.7 10*3/uL (ref 3.4–10.8)

## 2022-06-27 LAB — LIPID PANEL
Chol/HDL Ratio: 2.9 ratio (ref 0.0–5.0)
Cholesterol, Total: 188 mg/dL (ref 100–199)
HDL: 64 mg/dL (ref >39)
LDL Chol Calc (NIH): 96 mg/dL (ref 0–99)
Triglycerides: 164 mg/dL — ABNORMAL HIGH (ref 0–149)
VLDL Cholesterol Cal: 28 mg/dL (ref 5–40)

## 2022-06-27 LAB — PSA, TOTAL AND FREE
PSA, Free Pct: 23.3 %
PSA, Free: 0.28 ng/mL
Prostate Specific Ag, Serum: 1.2 ng/mL (ref 0.0–4.0)

## 2022-07-11 ENCOUNTER — Other Ambulatory Visit: Payer: Self-pay | Admitting: Nurse Practitioner

## 2022-07-11 DIAGNOSIS — J410 Simple chronic bronchitis: Secondary | ICD-10-CM

## 2022-10-22 ENCOUNTER — Other Ambulatory Visit: Payer: Self-pay | Admitting: Nurse Practitioner

## 2022-10-22 DIAGNOSIS — J410 Simple chronic bronchitis: Secondary | ICD-10-CM

## 2022-10-31 ENCOUNTER — Other Ambulatory Visit: Payer: Self-pay | Admitting: Nurse Practitioner

## 2022-10-31 DIAGNOSIS — J410 Simple chronic bronchitis: Secondary | ICD-10-CM

## 2022-12-25 ENCOUNTER — Ambulatory Visit: Payer: 59 | Admitting: Nurse Practitioner

## 2022-12-31 ENCOUNTER — Ambulatory Visit (INDEPENDENT_AMBULATORY_CARE_PROVIDER_SITE_OTHER): Payer: 59 | Admitting: Nurse Practitioner

## 2022-12-31 ENCOUNTER — Encounter: Payer: Self-pay | Admitting: Nurse Practitioner

## 2022-12-31 VITALS — BP 132/79 | HR 76 | Temp 98.0°F | Ht 69.0 in | Wt 219.0 lb

## 2022-12-31 DIAGNOSIS — F1020 Alcohol dependence, uncomplicated: Secondary | ICD-10-CM

## 2022-12-31 DIAGNOSIS — I10 Essential (primary) hypertension: Secondary | ICD-10-CM | POA: Diagnosis not present

## 2022-12-31 DIAGNOSIS — E785 Hyperlipidemia, unspecified: Secondary | ICD-10-CM | POA: Diagnosis not present

## 2022-12-31 DIAGNOSIS — J410 Simple chronic bronchitis: Secondary | ICD-10-CM | POA: Diagnosis not present

## 2022-12-31 DIAGNOSIS — Z6829 Body mass index (BMI) 29.0-29.9, adult: Secondary | ICD-10-CM

## 2022-12-31 DIAGNOSIS — R5383 Other fatigue: Secondary | ICD-10-CM

## 2022-12-31 MED ORDER — LISINOPRIL 40 MG PO TABS: 40.0000 mg | ORAL_TABLET | Freq: Every day | ORAL | 1 refills | Status: DC

## 2022-12-31 MED ORDER — PRAVASTATIN SODIUM 40 MG PO TABS: 40.0000 mg | ORAL_TABLET | Freq: Every day | ORAL | 1 refills | Status: DC

## 2022-12-31 MED ORDER — IPRATROPIUM-ALBUTEROL 0.5-2.5 (3) MG/3ML IN SOLN: 3.0000 mL | Freq: Four times a day (QID) | RESPIRATORY_TRACT | 1 refills | Status: DC | PRN

## 2022-12-31 NOTE — Progress Notes (Signed)
Subjective:    Patient ID: Philip Ashley, male    DOB: [birth year], 62 y.o.   MRN: [id number]   Chief Complaint: medical management of chronic issues     HPI:  Philip Ashley is a 62 y.o. who identifies as a male who was assigned male at birth.   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 Essential hypertension No c/o chest pain, sob or headache. Does not check blood pressure at home. BP Readings from Last 3 Encounters:  06/26/22 131/85  12/14/21 128/86  05/18/21 (!) 142/88     2. Hyperlipidemia with target LDL less than 100 Does not watch diet and does no dedicated exercise. Lab Results  Component Value Date   CHOL 188 06/26/2022   HDL 64 06/26/2022   LDLCALC 96 06/26/2022   TRIG 164 (H) 06/26/2022   CHOLHDL 2.9 06/26/2022   The 10-year ASCVD risk score (Arnett DK, et al., 2019) is: 11.9%    3. Simple chronic bronchitis (HCC) No recent flare ups. Is doing well.  4. Alcoholism (HCC) No longer drinks  5. BMI 29.0-29.9,adult Weight is up 7 lbs  Wt Readings from Last 3 Encounters:  12/31/22 219 lb (99.3 kg)  06/26/22 212 lb (96.2 kg)  12/14/21 215 lb (97.5 kg)   BMI Readings from Last 3 Encounters:  12/31/22 32.34 kg/m  06/26/22 31.31 kg/m  12/14/21 31.75 kg/m     New complaints: Says he is always fatigued. Would like to have his testosterone checked.   No Known Allergies Outpatient Encounter Medications as of 12/31/2022  Medication Sig   albuterol (VENTOLIN HFA) 108 (90 Base) MCG/ACT inhaler INHALE 2 PUFFS INTO LUNGS EVERY 4 HOURS AS NEEDED.   ipratropium-albuterol (DUONEB) 0.5-2.5 (3) MG/3ML SOLN Take 3 mLs by nebulization every 6 (six) hours as needed.   lisinopril (ZESTRIL) 40 MG tablet Take 1 tablet (40 mg total) by mouth daily.   pravastatin (PRAVACHOL) 40 MG tablet Take 1 tablet (40 mg total) by mouth daily.   SYMBICORT 160-4.5 MCG/ACT  inhaler inhale 2 puffs into the lungs TWICE DAILY.   No facility-administered encounter medications on file as of 12/31/2022.    No past surgical history on file.  No family history on file.    Controlled substance contract: n/a     Review of Systems  Constitutional:  Negative for diaphoresis.  Eyes:  Negative for pain.  Respiratory:  Negative for shortness of breath.   Cardiovascular:  Negative for chest pain, palpitations and leg swelling.  Gastrointestinal:  Negative for abdominal pain.  Endocrine: Negative for polydipsia.  Skin:  Negative for rash.  Neurological:  Negative for dizziness, weakness and headaches.  Hematological:  Does not bruise/bleed easily.  All other systems reviewed and are negative.      Objective:   Physical Exam Vitals and nursing note reviewed.  Constitutional:      Appearance: Normal appearance. He is well-developed.  HENT:     Head: Normocephalic.     Nose: Nose normal.     Mouth/Throat:     Mouth: Mucous membranes are moist.     Pharynx: Oropharynx is clear.  Eyes:     Pupils: Pupils are equal, round, and reactive to light.  Neck:     Thyroid: No thyroid mass or thyromegaly.     Vascular: No carotid bruit or JVD.     Trachea: Phonation normal.  Cardiovascular:  Rate and Rhythm: Normal rate and regular rhythm.  Pulmonary:     Effort: Pulmonary effort is normal. No respiratory distress.     Breath sounds: Normal breath sounds.  Abdominal:     General: Bowel sounds are normal.     Palpations: Abdomen is soft.     Tenderness: There is no abdominal tenderness.  Musculoskeletal:        General: Normal range of motion.     Cervical back: Normal range of motion and neck supple.  Lymphadenopathy:     Cervical: No cervical adenopathy.  Skin:    General: Skin is warm and dry.  Neurological:     Mental Status: He is alert and oriented to person, place, and time.  Psychiatric:        Behavior: Behavior normal.        Thought  Content: Thought content normal.        Judgment: Judgment normal.     BP 132/79   Pulse 76   Temp 98 F (36.7 C) (Temporal)   Ht 5\' 9"  (1.753 m)   Wt 219 lb (99.3 kg)   SpO2 93%   BMI 32.34 kg/m        Assessment & Plan:   Philip Ashley comes in today with chief complaint of Medical Management of Chronic Issues   Diagnosis and orders addressed:  1. Essential hypertension Low sodium diet - CBC with Differential/Platelet - CMP14+EGFR - Lipid panel - lisinopril (ZESTRIL) 40 MG tablet; Take 1 tablet (40 mg total) by mouth daily.  Dispense: 90 tablet; Refill: 1  2. Hyperlipidemia with target LDL less than 100 Low fat diet - pravastatin (PRAVACHOL) 40 MG tablet; Take 1 tablet (40 mg total) by mouth daily.  Dispense: 90 tablet; Refill: 1  3. Simple chronic bronchitis (HCC) - ipratropium-albuterol (DUONEB) 0.5-2.5 (3) MG/3ML SOLN; Take 3 mLs by nebulization every 6 (six) hours as needed.  Dispense: 360 mL; Refill: 1  4. Alcoholism (HCC) Avoid alcohol  5. BMI 29.0-29.9,adult Discussed diet and exercise for person with BMI >25 Will recheck weight in 3-6 months   6. Other fatigue Labs pending - Testosterone,Free and Total   Labs pending Health Maintenance reviewed Diet and exercise encouraged  Follow up plan: 6 months   Mary-Margaret Daphine Deutscher, FNP

## 2022-12-31 NOTE — Patient Instructions (Signed)
Fatigue If you have fatigue, you feel tired all the time and have a lack of energy or a lack of motivation. Fatigue may make it difficult to start or complete tasks because of exhaustion. Occasional or mild fatigue is often a normal response to activity or life. However, long-term (chronic) or extreme fatigue may be a symptom of a medical condition such as: Depression. Not having enough red blood cells or hemoglobin in the blood (anemia). A problem with a small gland located in the lower front part of the neck (thyroid disorder). Rheumatologic conditions. These are problems related to the body's defense system (immune system). Infections, especially certain viral infections. Fatigue can also lead to negative health outcomes over time. Follow these instructions at home: Medicines Take over-the-counter and prescription medicines only as told by your health care provider. Take a multivitamin if told by your health care provider. Do not use herbal or dietary supplements unless they are approved by your health care provider. Eating and drinking  Avoid heavy meals in the evening. Eat a well-balanced diet, which includes lean proteins, whole grains, plenty of fruits and vegetables, and low-fat dairy products. Avoid eating or drinking too many products with caffeine in them. Avoid alcohol. Drink enough fluid to keep your urine pale yellow. Activity  Exercise regularly, as told by your health care provider. Use or practice techniques to help you relax, such as yoga, tai chi, meditation, or massage therapy. Lifestyle Change situations that cause you stress. Try to keep your work and personal schedules in balance. Do not use recreational or illegal drugs. General instructions Monitor your fatigue for any changes. Go to bed and get up at the same time every day. Avoid fatigue by pacing yourself during the day and getting enough sleep at night. Maintain a healthy weight. Contact a health care  provider if: Your fatigue does not get better. You have a fever. You suddenly lose or gain weight. You have headaches. You have trouble falling asleep or sleeping through the night. You feel angry, guilty, anxious, or sad. You have swelling in your legs or another part of your body. Get help right away if: You feel confused, feel like you might faint, or faint. Your vision is blurry or you have a severe headache. You have severe pain in your abdomen, your back, or the area between your waist and hips (pelvis). You have chest pain, shortness of breath, or an irregular or fast heartbeat. You are unable to urinate, or you urinate less than normal. You have abnormal bleeding from the rectum, nose, lungs, nipples, or, if you are male, the vagina. You vomit blood. You have thoughts about hurting yourself or others. These symptoms may be an emergency. Get help right away. Call 911. Do not wait to see if the symptoms will go away. Do not drive yourself to the hospital. Get help right away if you feel like you may hurt yourself or others, or have thoughts about taking your own life. Go to your nearest emergency room or: Call 911. Call the National Suicide Prevention Lifeline at 1-800-273-8255 or 988. This is open 24 hours a day. Text the Crisis Text Line at 741741. Summary If you have fatigue, you feel tired all the time and have a lack of energy or a lack of motivation. Fatigue may make it difficult to start or complete tasks because of exhaustion. Long-term (chronic) or extreme fatigue may be a symptom of a medical condition. Exercise regularly, as told by your health care provider.   Change situations that cause you stress. Try to keep your work and personal schedules in balance. This information is not intended to replace advice given to you by your health care provider. Make sure you discuss any questions you have with your health care provider. Document Revised: 02/20/2021 Document  Reviewed: 02/20/2021 Elsevier Patient Education  2024 [employer]  

## 2023-01-02 LAB — LIPID PANEL
Chol/HDL Ratio: 3.1 ratio (ref 0.0–5.0)
Cholesterol, Total: 165 mg/dL (ref 100–199)
HDL: 54 mg/dL (ref >39)
LDL Chol Calc (NIH): 82 mg/dL (ref 0–99)
Triglycerides: 170 mg/dL — ABNORMAL HIGH (ref 0–149)
VLDL Cholesterol Cal: 29 mg/dL (ref 5–40)

## 2023-01-02 LAB — CMP14+EGFR
ALT: 27 IU/L (ref 0–44)
AST: 26 IU/L (ref 0–40)
Albumin: 4.4 g/dL (ref 3.9–4.9)
Alkaline Phosphatase: 58 IU/L (ref 44–121)
BUN/Creatinine Ratio: 14 (ref 10–24)
BUN: 11 mg/dL (ref 8–27)
Bilirubin Total: 0.3 mg/dL (ref 0.0–1.2)
CO2: 24 mmol/L (ref 20–29)
Calcium: 9.6 mg/dL (ref 8.6–10.2)
Chloride: 99 mmol/L (ref 96–106)
Creatinine, Ser: 0.81 mg/dL (ref 0.76–1.27)
Globulin, Total: 1.9 g/dL (ref 1.5–4.5)
Glucose: 83 mg/dL (ref 70–99)
Potassium: 5.1 mmol/L (ref 3.5–5.2)
Sodium: 137 mmol/L (ref 134–144)
Total Protein: 6.3 g/dL (ref 6.0–8.5)
eGFR: 98 mL/min/{1.73_m2} (ref >59)

## 2023-01-02 LAB — CBC WITH DIFFERENTIAL/PLATELET
Basophils Absolute: 0.1 10*3/uL (ref 0.0–0.2)
Basos: 1 %
EOS (ABSOLUTE): 0.5 10*3/uL — ABNORMAL HIGH (ref 0.0–0.4)
Eos: 6 %
Hematocrit: 45.2 % (ref 37.5–51.0)
Hemoglobin: 15.3 g/dL (ref 13.0–17.7)
Immature Grans (Abs): 0 10*3/uL (ref 0.0–0.1)
Immature Granulocytes: 1 %
Lymphocytes Absolute: 1.1 10*3/uL (ref 0.7–3.1)
Lymphs: 15 %
MCH: 30.7 pg (ref 26.6–33.0)
MCHC: 33.8 g/dL (ref 31.5–35.7)
MCV: 91 fL (ref 79–97)
Monocytes Absolute: 0.6 10*3/uL (ref 0.1–0.9)
Monocytes: 8 %
Neutrophils Absolute: 5 10*3/uL (ref 1.4–7.0)
Neutrophils: 69 %
Platelets: 264 10*3/uL (ref 150–450)
RBC: 4.99 x10E6/uL (ref 4.14–5.80)
RDW: 12.7 % (ref 11.6–15.4)
WBC: 7.3 10*3/uL (ref 3.4–10.8)

## 2023-01-02 LAB — TESTOSTERONE,FREE AND TOTAL
Testosterone, Free: 2.8 pg/mL — ABNORMAL LOW (ref 6.6–18.1)
Testosterone: 367 ng/dL (ref 264–916)

## 2023-01-30 ENCOUNTER — Other Ambulatory Visit: Payer: Self-pay | Admitting: Nurse Practitioner

## 2023-01-30 DIAGNOSIS — J410 Simple chronic bronchitis: Secondary | ICD-10-CM

## 2023-03-07 ENCOUNTER — Other Ambulatory Visit: Payer: Self-pay | Admitting: Nurse Practitioner

## 2023-03-07 DIAGNOSIS — I10 Essential (primary) hypertension: Secondary | ICD-10-CM

## 2023-04-21 ENCOUNTER — Other Ambulatory Visit: Payer: Self-pay | Admitting: Nurse Practitioner

## 2023-04-21 DIAGNOSIS — E785 Hyperlipidemia, unspecified: Secondary | ICD-10-CM

## 2023-05-02 ENCOUNTER — Other Ambulatory Visit: Payer: Self-pay | Admitting: Nurse Practitioner

## 2023-05-02 DIAGNOSIS — J410 Simple chronic bronchitis: Secondary | ICD-10-CM

## 2023-05-06 ENCOUNTER — Other Ambulatory Visit: Payer: Self-pay | Admitting: Nurse Practitioner

## 2023-05-06 DIAGNOSIS — J410 Simple chronic bronchitis: Secondary | ICD-10-CM

## 2023-07-02 ENCOUNTER — Ambulatory Visit: Payer: 59 | Admitting: Nurse Practitioner

## 2023-07-13 ENCOUNTER — Other Ambulatory Visit: Payer: Self-pay | Admitting: Nurse Practitioner

## 2023-07-13 DIAGNOSIS — J410 Simple chronic bronchitis: Secondary | ICD-10-CM

## 2023-07-25 ENCOUNTER — Encounter: Payer: Self-pay | Admitting: Nurse Practitioner

## 2023-07-25 ENCOUNTER — Ambulatory Visit: Payer: 59 | Admitting: Nurse Practitioner

## 2023-07-25 ENCOUNTER — Ambulatory Visit (INDEPENDENT_AMBULATORY_CARE_PROVIDER_SITE_OTHER)

## 2023-07-25 VITALS — BP 148/84 | HR 81 | Temp 97.8°F | Ht 69.0 in | Wt 206.0 lb

## 2023-07-25 DIAGNOSIS — Z0001 Encounter for general adult medical examination with abnormal findings: Secondary | ICD-10-CM | POA: Diagnosis not present

## 2023-07-25 DIAGNOSIS — F1020 Alcohol dependence, uncomplicated: Secondary | ICD-10-CM

## 2023-07-25 DIAGNOSIS — E785 Hyperlipidemia, unspecified: Secondary | ICD-10-CM | POA: Diagnosis not present

## 2023-07-25 DIAGNOSIS — Z0000 Encounter for general adult medical examination without abnormal findings: Secondary | ICD-10-CM | POA: Diagnosis not present

## 2023-07-25 DIAGNOSIS — Z125 Encounter for screening for malignant neoplasm of prostate: Secondary | ICD-10-CM

## 2023-07-25 DIAGNOSIS — F320 Major depressive disorder, single episode, mild: Secondary | ICD-10-CM

## 2023-07-25 DIAGNOSIS — J410 Simple chronic bronchitis: Secondary | ICD-10-CM

## 2023-07-25 DIAGNOSIS — I10 Essential (primary) hypertension: Secondary | ICD-10-CM | POA: Diagnosis not present

## 2023-07-25 DIAGNOSIS — Z6829 Body mass index (BMI) 29.0-29.9, adult: Secondary | ICD-10-CM

## 2023-07-25 MED ORDER — PRAVASTATIN SODIUM 40 MG PO TABS: 40.0000 mg | ORAL_TABLET | Freq: Every day | ORAL | 1 refills | Status: DC

## 2023-07-25 MED ORDER — LISINOPRIL 40 MG PO TABS: 40.0000 mg | ORAL_TABLET | Freq: Every day | ORAL | 1 refills | Status: DC

## 2023-07-25 MED ORDER — ESCITALOPRAM OXALATE 10 MG PO TABS: 10.0000 mg | ORAL_TABLET | Freq: Every day | ORAL | 1 refills | Status: DC

## 2023-07-25 NOTE — Patient Instructions (Signed)

## 2023-07-25 NOTE — Progress Notes (Signed)
 Subjective:    Patient ID: Philip Ashley, male    DOB: 17-May-1960, 63 y.o.   MRN: [id number]   Chief Complaint: annual physical    HPI:  Philip Ashley is a 63 y.o. who identifies as a male who was assigned male at birth.   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 Annual physical  2. Essential hypertension No c/o chest pain, sob or headache. Does not check blood pressure at home. BP Readings from Last 3 Encounters:  12/31/22 132/79  06/26/22 131/85  12/14/21 128/86     3. Hyperlipidemia with target LDL less than 100 Does not watch diet and does no dedicated exercise. Lab Results  Component Value Date   CHOL 165 12/31/2022   HDL 54 12/31/2022   LDLCALC 82 12/31/2022   TRIG 170 (H) 12/31/2022   CHOLHDL 3.1 12/31/2022   The 10-year ASCVD risk score (Arnett DK, et al., 2019) is: 13.1%    4. Simple chronic bronchitis (HCC) No recent flare ups. Is doing well.  5. Alcoholism (HCC) No longer drinks  6. BMI 29.0-29.9,adult Weight is up 13lbs Wt Readings from Last 3 Encounters:  07/25/23 206 lb (93.4 kg)  12/31/22 219 lb (99.3 kg)  06/26/22 212 lb (96.2 kg)   BMI Readings from Last 3 Encounters:  07/25/23 30.42 kg/m  12/31/22 32.34 kg/m  06/26/22 31.31 kg/m      New complaints: Has been feeling depressed lately. Wife and daughter thinks that he needs meds.    07/25/2023    9:48 AM 12/31/2022    8:46 AM 06/26/2022    3:28 PM  Depression screen PHQ 2/9  Decreased Interest 1 0 0  Down, Depressed, Hopeless 1 0 0  PHQ - 2 Score 2 0 0  Altered sleeping 2 1 1   Tired, decreased energy 2 3 3   Change in appetite 1 0 0  Feeling bad or failure about yourself  0 0 0  Trouble concentrating 0 0 0  Moving slowly or fidgety/restless 0 0 0  Suicidal thoughts 0 0 0  PHQ-9 Score 7 4 4   Difficult doing work/chores Somewhat difficult Somewhat difficult Not  difficult at all     No Known Allergies Outpatient Encounter Medications as of 07/25/2023  Medication Sig   albuterol (VENTOLIN HFA) 108 (90 Base) MCG/ACT inhaler INHALE 2 PUFFS INTO LUNGS EVERY 4 HOURS AS NEEDED.   budesonide-formoterol (SYMBICORT) 160-4.5 MCG/ACT inhaler inhale 2 puffs into the lungs TWICE DAILY.   ipratropium-albuterol (DUONEB) 0.5-2.5 (3) MG/3ML SOLN Take 3 mLs by nebulization every 6 (six) hours as needed.   lisinopril (ZESTRIL) 40 MG tablet TAKE ONE TABLET ONCE DAILY   Omega-3 Fatty Acids (FISH OIL) 1200 MG CPDR Take by mouth.   pravastatin (PRAVACHOL) 40 MG tablet TAKE ONE TABLET ONCE DAILY   No facility-administered encounter medications on file as of 07/25/2023.    No past surgical history on file.  No family history on file.    Controlled substance contract: n/a     Review of Systems  Constitutional:  Negative for diaphoresis.  Eyes:  Negative for pain.  Respiratory:  Negative for shortness of breath.   Cardiovascular:  Negative for chest pain, palpitations and leg swelling.  Gastrointestinal:  Negative for abdominal pain.  Endocrine: Negative for polydipsia.  Skin:  Negative for rash.  Neurological:  Negative for dizziness, weakness and headaches.  Hematological:  Does  not bruise/bleed easily.  All other systems reviewed and are negative.      Objective:   Physical Exam Vitals and nursing note reviewed.  Constitutional:      Appearance: Normal appearance. He is well-developed.  HENT:     Head: Normocephalic.     Nose: Nose normal.     Mouth/Throat:     Mouth: Mucous membranes are moist.     Pharynx: Oropharynx is clear.  Eyes:     Pupils: Pupils are equal, round, and reactive to light.  Neck:     Thyroid: No thyroid mass or thyromegaly.     Vascular: No carotid bruit or JVD.     Trachea: Phonation normal.  Cardiovascular:     Rate and Rhythm: Normal rate and regular rhythm.  Pulmonary:     Effort: Pulmonary effort is normal. No  respiratory distress.     Breath sounds: Normal breath sounds.  Abdominal:     General: Bowel sounds are normal.     Palpations: Abdomen is soft.     Tenderness: There is no abdominal tenderness.  Musculoskeletal:        General: Normal range of motion.     Cervical back: Normal range of motion and neck supple.  Lymphadenopathy:     Cervical: No cervical adenopathy.  Skin:    General: Skin is warm and dry.  Neurological:     Mental Status: He is alert and oriented to person, place, and time.  Psychiatric:        Behavior: Behavior normal.        Thought Content: Thought content normal.        Judgment: Judgment normal.     BP (!) 148/84   Pulse 81   Temp 97.8 F (36.6 C) (Temporal)   Ht 5\' 9"  (1.753 m)   Wt 206 lb (93.4 kg)   SpO2 95%   BMI 30.42 kg/m   EKG NSR-Mary-Margaret Daphine Deutscher, FNP Chest xray- radiology report pending-Preliminary reading by Paulene Floor, FNP  [facility]       Assessment & Plan:   [name] comes in today with chief complaint of annual physical  Diagnosis and orders addressed:  1. Annual physical   2. Essential hypertension Low sodium diet - CBC with Differential/Platelet - CMP14+EGFR - Lipid panel - lisinopril (ZESTRIL) 40 MG tablet; Take 1 tablet (40 mg total) by mouth daily.  Dispense: 90 tablet; Refill: 1  3. Hyperlipidemia with target LDL less than 100 Low fat diet - pravastatin (PRAVACHOL) 40 MG tablet; Take 1 tablet (40 mg total) by mouth daily.  Dispense: 90 tablet; Refill: 1  4. Simple chronic bronchitis (HCC) - ipratropium-albuterol (DUONEB) 0.5-2.5 (3) MG/3ML SOLN; Take 3 mLs by nebulization every 6 (six) hours as needed.  Dispense: 360 mL; Refill: 1  5. Alcoholism (HCC) Avoid alcohol  6. BMI 29.0-29.9,adult Discussed diet and exercise for person with BMI >25 Will recheck weight in 3-6 months   7. depression Stress management -Lexapro 10mg  1 po daily #90 1 refill   Labs pending Health Maintenance reviewed Diet  and exercise encouraged  Follow up plan: 6 months   Mary-Margaret Daphine Deutscher, FNP

## 2023-07-26 ENCOUNTER — Other Ambulatory Visit: Payer: Self-pay | Admitting: Nurse Practitioner

## 2023-07-26 DIAGNOSIS — E785 Hyperlipidemia, unspecified: Secondary | ICD-10-CM

## 2023-07-26 LAB — CMP14+EGFR
ALT: 27 IU/L (ref 0–44)
AST: 21 IU/L (ref 0–40)
Albumin: 4.6 g/dL (ref 3.9–4.9)
Alkaline Phosphatase: 64 IU/L (ref 44–121)
BUN/Creatinine Ratio: 11 (ref 10–24)
BUN: 10 mg/dL (ref 8–27)
Bilirubin Total: 0.5 mg/dL (ref 0.0–1.2)
CO2: 24 mmol/L (ref 20–29)
Calcium: 9.5 mg/dL (ref 8.6–10.2)
Chloride: 102 mmol/L (ref 96–106)
Creatinine, Ser: 0.89 mg/dL (ref 0.76–1.27)
Globulin, Total: 1.8 g/dL (ref 1.5–4.5)
Glucose: 80 mg/dL (ref 70–99)
Potassium: 5 mmol/L (ref 3.5–5.2)
Sodium: 140 mmol/L (ref 134–144)
Total Protein: 6.4 g/dL (ref 6.0–8.5)
eGFR: 95 mL/min/{1.73_m2} (ref >59)

## 2023-07-26 LAB — CBC WITH DIFFERENTIAL/PLATELET
Basophils Absolute: 0.1 10*3/uL (ref 0.0–0.2)
Basos: 1 %
EOS (ABSOLUTE): 0.5 10*3/uL — ABNORMAL HIGH (ref 0.0–0.4)
Eos: 7 %
Hematocrit: 47.7 % (ref 37.5–51.0)
Hemoglobin: 16.2 g/dL (ref 13.0–17.7)
Immature Grans (Abs): 0 10*3/uL (ref 0.0–0.1)
Immature Granulocytes: 0 %
Lymphocytes Absolute: 1.2 10*3/uL (ref 0.7–3.1)
Lymphs: 16 %
MCH: 30.2 pg (ref 26.6–33.0)
MCHC: 34 g/dL (ref 31.5–35.7)
MCV: 89 fL (ref 79–97)
Monocytes Absolute: 0.5 10*3/uL (ref 0.1–0.9)
Monocytes: 7 %
Neutrophils Absolute: 5.1 10*3/uL (ref 1.4–7.0)
Neutrophils: 69 %
Platelets: 281 10*3/uL (ref 150–450)
RBC: 5.37 x10E6/uL (ref 4.14–5.80)
RDW: 13 % (ref 11.6–15.4)
WBC: 7.5 10*3/uL (ref 3.4–10.8)

## 2023-07-26 LAB — LIPID PANEL
Chol/HDL Ratio: 2.7 ratio (ref 0.0–5.0)
Cholesterol, Total: 175 mg/dL (ref 100–199)
HDL: 65 mg/dL (ref >39)
LDL Chol Calc (NIH): 85 mg/dL (ref 0–99)
Triglycerides: 145 mg/dL (ref 0–149)
VLDL Cholesterol Cal: 25 mg/dL (ref 5–40)

## 2023-07-26 LAB — PSA, TOTAL AND FREE
PSA, Free Pct: 25 %
PSA, Free: 0.4 ng/mL
Prostate Specific Ag, Serum: 1.6 ng/mL (ref 0.0–4.0)

## 2023-08-21 ENCOUNTER — Other Ambulatory Visit: Payer: Self-pay | Admitting: Nurse Practitioner

## 2023-08-21 DIAGNOSIS — J410 Simple chronic bronchitis: Secondary | ICD-10-CM

## 2023-08-22 ENCOUNTER — Other Ambulatory Visit: Payer: Self-pay | Admitting: Family Medicine

## 2023-08-22 DIAGNOSIS — J410 Simple chronic bronchitis: Secondary | ICD-10-CM

## 2023-08-22 MED ORDER — ALBUTEROL SULFATE HFA 108 (90 BASE) MCG/ACT IN AERS: INHALATION_SPRAY | RESPIRATORY_TRACT | 3 refills | Status: DC

## 2023-09-01 ENCOUNTER — Other Ambulatory Visit: Payer: Self-pay | Admitting: Nurse Practitioner

## 2023-09-01 DIAGNOSIS — I10 Essential (primary) hypertension: Secondary | ICD-10-CM

## 2023-11-24 ENCOUNTER — Other Ambulatory Visit: Payer: Self-pay | Admitting: Nurse Practitioner

## 2023-11-24 DIAGNOSIS — J410 Simple chronic bronchitis: Secondary | ICD-10-CM

## 2023-11-27 ENCOUNTER — Other Ambulatory Visit: Payer: Self-pay | Admitting: Nurse Practitioner

## 2023-11-27 DIAGNOSIS — J410 Simple chronic bronchitis: Secondary | ICD-10-CM

## 2024-01-20 ENCOUNTER — Ambulatory Visit (INDEPENDENT_AMBULATORY_CARE_PROVIDER_SITE_OTHER)

## 2024-01-20 ENCOUNTER — Ambulatory Visit: Admitting: Nurse Practitioner

## 2024-01-20 ENCOUNTER — Encounter: Payer: Self-pay | Admitting: Nurse Practitioner

## 2024-01-20 VITALS — BP 159/105 | HR 98 | Temp 97.8°F | Ht 69.0 in | Wt 206.0 lb

## 2024-01-20 DIAGNOSIS — R0602 Shortness of breath: Secondary | ICD-10-CM

## 2024-01-20 DIAGNOSIS — J410 Simple chronic bronchitis: Secondary | ICD-10-CM | POA: Diagnosis not present

## 2024-01-20 MED ORDER — PREDNISONE 10 MG (21) PO TBPK: ORAL_TABLET | ORAL | 0 refills | Status: DC

## 2024-01-20 MED ORDER — ALBUTEROL SULFATE HFA 108 (90 BASE) MCG/ACT IN AERS: INHALATION_SPRAY | RESPIRATORY_TRACT | 0 refills | Status: DC

## 2024-01-20 MED ORDER — BUDESONIDE-FORMOTEROL FUMARATE 160-4.5 MCG/ACT IN AERO: 2.0000 | INHALATION_SPRAY | Freq: Two times a day (BID) | RESPIRATORY_TRACT | 1 refills | Status: DC

## 2024-01-20 MED ORDER — LEVALBUTEROL HCL 1.25 MG/3ML IN NEBU
1.2500 mg | INHALATION_SOLUTION | RESPIRATORY_TRACT | Status: AC
  Administered 2024-01-20: 1.25 mg via RESPIRATORY_TRACT

## 2024-01-20 MED ORDER — METHYLPREDNISOLONE ACETATE 80 MG/ML IJ SUSP
80.0000 mg | Freq: Once | INTRAMUSCULAR | Status: AC
Start: 2024-01-20 — End: 2024-01-20
  Administered 2024-01-20: 80 mg via INTRAMUSCULAR

## 2024-01-20 NOTE — Progress Notes (Signed)
   Subjective:    Patient ID: Philip Ashley, male    DOB: 1960/08/12, 63 y.o.   MRN: [id number]   Chief Complaint: SOB  HPI  Patient in c/o of SOB. He stopped vaping 2 weeks ago.has used rescue inhaler completely up in the  last 2 week. Has been using nebulizer 3x a day. He has also been using his symbicort  but not daily, only as needed. Patient Active Problem List   Diagnosis Date Noted   BMI 29.0-29.9,adult 01/21/2015   Essential hypertension 01/21/2015   COPD (chronic obstructive pulmonary disease) (HCC) 06/11/2014   Hyperlipidemia with target LDL less than 100 06/11/2014   Alcoholism (HCC) 06/11/2014        Review of Systems  Constitutional:  Negative for diaphoresis.  Eyes:  Negative for pain.  Respiratory:  Positive for cough and shortness of breath.   Cardiovascular:  Negative for chest pain, palpitations and leg swelling.  Gastrointestinal:  Negative for abdominal pain.  Endocrine: Negative for polydipsia.  Skin:  Negative for rash.  Neurological:  Negative for dizziness, weakness and headaches.  Hematological:  Does not bruise/bleed easily.  All other systems reviewed and are negative.      Objective:   Physical Exam Constitutional:      Appearance: Normal appearance.  Cardiovascular:     Rate and Rhythm: Normal rate and regular rhythm.     Heart sounds: Normal heart sounds.  Pulmonary:     Comments: Diminished breath sounds throuhout Deep cough Neurological:     General: No focal deficit present.     Mental Status: He is alert and oriented to person, place, and time.  Psychiatric:        Mood and Affect: Mood normal.        Behavior: Behavior normal.       BP (!) 159/105   Pulse 98   Temp 97.8 F (36.6 C)   Ht 5' 9 (1.753 m)   Wt 206 lb (93.4 kg)   SpO2 92%   BMI 30.42 kg/m   O2 sat 89% with exercise- HR 105.  Xopenex  neb- s/p treatment- increased air exchange in lungs     Assessment & Plan:   Jock Mahon in today with chief  complaint of No chief complaint on file.   1. SOB (shortness of breath) (Primary)  - DG Chest 2 View; Future - methylPREDNISolone  acetate (DEPO-MEDROL ) injection 80 mg - predniSONE  (STERAPRED UNI-PAK 21 TAB) 10 MG (21) TBPK tablet; As directed x 6 days  Dispense: 21 tablet; Refill: 0  2. Simple chronic bronchitis (HCC) USE SYMBICORT  EVERY DAY Duoneb BID prn Albuterol  inhaler q4-6 as needed Continue to avoid vaping Steroids as prescribed - budesonide -formoterol  (SYMBICORT ) 160-4.5 MCG/ACT inhaler; Inhale 2 puffs into the lungs 2 (two) times daily.  Dispense: 10.2 g; Refill: 1 - albuterol  (VENTOLIN  HFA) 108 (90 Base) MCG/ACT inhaler; INHALE TWO puffs into the lungs EVERY FOUR HOURS.  Dispense: 8.5 g; Refill: 0 - methylPREDNISolone  acetate (DEPO-MEDROL ) injection 80 mg - levalbuterol  (XOPENEX ) nebulizer solution 1.25 mg    The above assessment and management plan was discussed with the patient. The patient verbalized understanding of and has agreed to the management plan. Patient is aware to call the clinic if symptoms persist or worsen. Patient is aware when to return to the clinic for a follow-up visit. Patient educated on when it is appropriate to go to the emergency department.   Mary-Margaret Gladis, FNP

## 2024-01-20 NOTE — Patient Instructions (Signed)

## 2024-01-21 ENCOUNTER — Ambulatory Visit: Admitting: Nurse Practitioner

## 2024-01-27 ENCOUNTER — Ambulatory Visit: Payer: Self-pay | Admitting: Nurse Practitioner

## 2024-02-03 ENCOUNTER — Other Ambulatory Visit: Payer: Self-pay | Admitting: Nurse Practitioner

## 2024-02-03 DIAGNOSIS — J410 Simple chronic bronchitis: Secondary | ICD-10-CM

## 2024-02-03 MED ORDER — PREDNISONE 20 MG PO TABS: 40.0000 mg | ORAL_TABLET | Freq: Every day | ORAL | 0 refills | Status: AC

## 2024-02-04 ENCOUNTER — Other Ambulatory Visit: Payer: Self-pay | Admitting: Nurse Practitioner

## 2024-02-04 DIAGNOSIS — E785 Hyperlipidemia, unspecified: Secondary | ICD-10-CM

## 2024-02-14 ENCOUNTER — Other Ambulatory Visit: Payer: Self-pay | Admitting: Nurse Practitioner

## 2024-02-14 DIAGNOSIS — J410 Simple chronic bronchitis: Secondary | ICD-10-CM

## 2024-02-24 ENCOUNTER — Ambulatory Visit: Admitting: Nurse Practitioner

## 2024-02-24 ENCOUNTER — Encounter: Payer: Self-pay | Admitting: Nurse Practitioner

## 2024-02-24 VITALS — BP 138/92 | HR 91 | Temp 97.1°F | Ht 69.0 in | Wt 199.0 lb

## 2024-02-24 DIAGNOSIS — E785 Hyperlipidemia, unspecified: Secondary | ICD-10-CM

## 2024-02-24 DIAGNOSIS — I10 Essential (primary) hypertension: Secondary | ICD-10-CM

## 2024-02-24 DIAGNOSIS — M25552 Pain in left hip: Secondary | ICD-10-CM

## 2024-02-24 DIAGNOSIS — Z6829 Body mass index (BMI) 29.0-29.9, adult: Secondary | ICD-10-CM

## 2024-02-24 DIAGNOSIS — J410 Simple chronic bronchitis: Secondary | ICD-10-CM

## 2024-02-24 DIAGNOSIS — F320 Major depressive disorder, single episode, mild: Secondary | ICD-10-CM

## 2024-02-24 DIAGNOSIS — F419 Anxiety disorder, unspecified: Secondary | ICD-10-CM

## 2024-02-24 DIAGNOSIS — F1020 Alcohol dependence, uncomplicated: Secondary | ICD-10-CM | POA: Diagnosis not present

## 2024-02-24 LAB — LIPID PANEL

## 2024-02-24 MED ORDER — CELECOXIB 200 MG PO CAPS: 200.0000 mg | ORAL_CAPSULE | Freq: Two times a day (BID) | ORAL | 2 refills | Status: AC

## 2024-02-24 MED ORDER — BUDESONIDE-FORMOTEROL FUMARATE 160-4.5 MCG/ACT IN AERO: 2.0000 | INHALATION_SPRAY | Freq: Two times a day (BID) | RESPIRATORY_TRACT | 1 refills | Status: DC

## 2024-02-24 MED ORDER — IPRATROPIUM-ALBUTEROL 0.5-2.5 (3) MG/3ML IN SOLN: 3.0000 mL | Freq: Four times a day (QID) | RESPIRATORY_TRACT | 1 refills | Status: DC | PRN

## 2024-02-24 MED ORDER — ESCITALOPRAM OXALATE 20 MG PO TABS: 20.0000 mg | ORAL_TABLET | Freq: Every day | ORAL | 5 refills | Status: DC

## 2024-02-24 MED ORDER — PRAVASTATIN SODIUM 40 MG PO TABS: 40.0000 mg | ORAL_TABLET | Freq: Every day | ORAL | 1 refills | Status: AC

## 2024-02-24 MED ORDER — LISINOPRIL 40 MG PO TABS: 40.0000 mg | ORAL_TABLET | Freq: Every day | ORAL | 1 refills | Status: DC

## 2024-02-24 NOTE — Progress Notes (Signed)
 Subjective:    Patient ID: Philip Ashley, male    DOB: [birth year], 63 y.o.   MRN: [id number]   Chief Complaint: medical management of chronic issues     HPI:  Philip Ashley is a 63 y.o. who identifies as a male who was assigned male at birth.   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 Essential hypertension No c/o chest pain,  or headache. Does not check blood pressure at home. BP Readings from Last 3 Encounters:  02/24/24 (!) 138/92  01/20/24 (!) 159/105  07/25/23 (!) 148/84      2. Hyperlipidemia with target LDL less than 100 Does not watch diet and does no dedicated exercise. Lab Results  Component Value Date   CHOL 175 07/25/2023   HDL 65 07/25/2023   LDLCALC 85 07/25/2023   TRIG 145 07/25/2023   CHOLHDL 2.7 07/25/2023   The 10-year ASCVD risk score (Arnett DK, et al., 2019) is: 16.7%    3. Simple chronic bronchitis (HCC) He has been having lots of issues with his breathing. Has been treated for bronchitis 2 x since his last follow up. He still is having trouble catching his breath. He has a referral in works but appointment has not been made. He has quit smoking in the last few weeks. He also has quit vaping.   4. Alcoholism (HCC) No longer drinks  5. BMI 29.0-29.9,adult Weight is unchanged  Wt Readings from Last 3 Encounters:  01/20/24 206 lb (93.4 kg)  07/25/23 206 lb (93.4 kg)  12/31/22 219 lb (99.3 kg)   BMI Readings from Last 3 Encounters:  01/20/24 30.42 kg/m  07/25/23 30.42 kg/m  12/31/22 32.34 kg/m     New complaints: GAD- wonders if anxiety is causing his SOB. 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 He worries all the time about money.    02/24/2024    9:02 AM 07/25/2023    9:48 AM 12/31/2022    8:46 AM 06/26/2022    3:31 PM  GAD 7 : Generalized Anxiety Score  Nervous, Anxious, on Edge 1 1 1 1    Control/stop worrying 3 3 3 3   Worry too much - different things 3  3 3   Trouble relaxing 1 0 0 0  Restless 0 0 0 0  Easily annoyed or irritable 0 0 0 0  Afraid - awful might happen 0 1 0 0  Total GAD 7 Score 8  7 7   Anxiety Difficulty Somewhat difficult Somewhat difficult Somewhat difficult Somewhat difficult   Left hip pain- started Saturday morning. Denies injury. Radiates up his back. Rates pain 0/10 currently. No reason , will just start hurting. Walking increases pain when he has it. Test and sitting helps pain.   No Known Allergies Outpatient Encounter Medications as of 02/24/2024  Medication Sig   albuterol  (VENTOLIN  HFA) 108 (90 Base) MCG/ACT inhaler INHALE TWO puffs into the lungs EVERY FOUR HOURS.   budesonide -formoterol  (SYMBICORT ) 160-4.5 MCG/ACT inhaler Inhale 2 puffs into the lungs 2 (two) times daily.   escitalopram  (LEXAPRO ) 10 MG tablet Take 1 tablet (10 mg total) by mouth daily.   ipratropium-albuterol  (DUONEB) 0.5-2.5 (3) MG/3ML SOLN USE ONE VIAL IN NEBULIZER EVERY 6 HOURS AS NEEDED   lisinopril  (ZESTRIL ) 40 MG tablet TAKE ONE TABLET ONCE DAILY   Omega-3 Fatty Acids (FISH OIL) 1200 MG CPDR Take by mouth.  pravastatin  (PRAVACHOL ) 40 MG tablet TAKE ONE TABLET BY MOUTH DAILY   No facility-administered encounter medications on file as of 02/24/2024.    No past surgical history on file.  No family history on file.    Controlled substance contract: n/a     Review of Systems  Constitutional:  Negative for diaphoresis.  Eyes:  Negative for pain.  Respiratory:  Negative for shortness of breath.   Cardiovascular:  Negative for chest pain, palpitations and leg swelling.  Gastrointestinal:  Negative for abdominal pain.  Endocrine: Negative for polydipsia.  Skin:  Negative for rash.  Neurological:  Negative for dizziness, weakness and headaches.  Hematological:  Does not bruise/bleed easily.  All other systems reviewed and are negative.      Objective:    Physical Exam Vitals and nursing note reviewed.  Constitutional:      Appearance: Normal appearance. He is well-developed.  HENT:     Head: Normocephalic.     Nose: Nose normal.     Mouth/Throat:     Mouth: Mucous membranes are moist.     Pharynx: Oropharynx is clear.  Eyes:     Pupils: Pupils are equal, round, and reactive to light.  Neck:     Thyroid : No thyroid  mass or thyromegaly.     Vascular: No carotid bruit or JVD.     Trachea: Phonation normal.  Cardiovascular:     Rate and Rhythm: Normal rate and regular rhythm.  Pulmonary:     Effort: Pulmonary effort is normal. No respiratory distress.     Breath sounds: Wheezing (exp throughout) present.  Abdominal:     General: Bowel sounds are normal.     Palpations: Abdomen is soft.     Tenderness: There is no abdominal tenderness.  Musculoskeletal:        General: Normal range of motion.     Cervical back: Normal range of motion and neck supple.     Comments: FROM of left hip without pain (-) SLR bil  Motor strength and sensation distally intack  Lymphadenopathy:     Cervical: No cervical adenopathy.  Skin:    General: Skin is warm and dry.  Neurological:     Mental Status: He is alert and oriented to person, place, and time.  Psychiatric:        Behavior: Behavior normal.        Thought Content: Thought content normal.        Judgment: Judgment normal.     BP (!) 138/92   Pulse 91   Temp (!) 97.1 F (36.2 C) (Temporal)   Ht 5' 9 (1.753 m)   Wt 199 lb (90.3 kg)   SpO2 93%   BMI 29.39 kg/m         Assessment & Plan:  Philip Ashley comes in today with chief complaint of medical management of chronic issues    Diagnosis and orders addressed:  1. Essential hypertension (Primary) Low sodium diet - lisinopril  (ZESTRIL ) 40 MG tablet; Take 1 tablet (40 mg total) by mouth daily.  Dispense: 90 tablet; Refill: 1 - CBC with Differential/Platelet - CMP14+EGFR  2. Hyperlipidemia with target LDL less than  100 Low fat diet - pravastatin  (PRAVACHOL ) 40 MG tablet; Take 1 tablet (40 mg total) by mouth daily.  Dispense: 90 tablet; Refill: 1 - Lipid panel  3. Simple chronic bronchitis [facility]) See pulmonology Will try to find out what is going on wit pulmonology referral - budesonide -formoterol  (SYMBICORT ) 160-4.5 MCG/ACT inhaler; Inhale 2 puffs  into the lungs 2 (two) times daily.  Dispense: 10.2 g; Refill: 1 - ipratropium-albuterol  (DUONEB) 0.5-2.5 (3) MG/3ML SOLN; Take 3 mLs by nebulization every 6 (six) hours as needed.  Dispense: 180 mL; Refill: 1  4. Alcoholism (HCC) Continue to avoid alcohol  5. BMI 29.0-29.9,adult Discussed diet and exercise for person with BMI >25 Will recheck weight in 3-6 months   6. Depression, major, single episode, mild Stress management  7. Anxiety Deep breathing exercises Increased lexapro  to 20mg  daily - escitalopram  (LEXAPRO ) 20 MG tablet; Take 1 tablet (20 mg total) by mouth daily.  Dispense: 30 tablet; Refill: 5  8. Left hip pain Moist heat Rest RTO prn - celecoxib (CELEBREX) 200 MG capsule; Take 1 capsule (200 mg total) by mouth 2 (two) times daily.  Dispense: 60 capsule; Refill: 2   Labs pending Health Maintenance reviewed Diet and exercise encouraged  Follow up plan: 6 months   Mary-Margaret Gladis, FNP

## 2024-02-24 NOTE — Patient Instructions (Signed)
 Hip Pain The hip is the joint between the upper legs and the lower pelvis. The bones, cartilage, tendons, and muscles of your hip joint support your body and allow you to move around. Hip pain can range from a minor ache to severe pain in one or both of your hips. The pain may be felt on the inside of the hip joint near the groin, or on the outside near the buttocks and upper thigh. You may also have swelling or stiffness in your hip area. Follow these instructions at home: Managing pain, stiffness, and swelling     If told, put ice on the painful area. Put ice in a plastic bag. Place a towel between your skin and the bag. Leave the ice on for 20 minutes, 2-3 times a day. If told, apply heat to the affected area as often as told by your health care provider. Use the heat source that your provider recommends, such as a moist heat pack or a heating pad. Place a towel between your skin and the heat source. Leave the heat on for 20-30 minutes. If your skin turns bright red, remove the ice or heat right away to prevent skin damage. The risk of damage is higher if you cannot feel pain, heat, or cold. Activity Do exercises as told by your provider. Avoid activities that cause pain. General instructions  Take over-the-counter and prescription medicines only as told by your provider. Keep a journal of your symptoms. Write down: How often you have hip pain. The location of your pain. What the pain feels like. What makes the pain worse. Sleep with a pillow between your legs on your most comfortable side. Keep all follow-up visits. Your provider will monitor your pain and activity. Contact a health care provider if: You cannot put weight on your leg. Your pain or swelling gets worse after a week. It gets harder to walk. You have a fever. Get help right away if: You fall. You have a sudden increase in pain and swelling in your hip. Your hip is red or swollen or very tender to touch. This  information is not intended to replace advice given to you by your health care provider. Make sure you discuss any questions you have with your health care provider. Document Revised: 01/02/2022 Document Reviewed: 01/02/2022 Elsevier Patient Education  2024 [employer].

## 2024-02-25 ENCOUNTER — Ambulatory Visit: Payer: Self-pay | Admitting: Nurse Practitioner

## 2024-02-25 LAB — LIPID PANEL
Cholesterol, Total: 144 mg/dL (ref 100–199)
HDL: 63 mg/dL (ref >39)
LDL CALC COMMENT:: 2.3 ratio (ref 0.0–5.0)
LDL Chol Calc (NIH): 64 mg/dL (ref 0–99)
Triglycerides: 88 mg/dL (ref 0–149)
VLDL Cholesterol Cal: 17 mg/dL (ref 5–40)

## 2024-02-25 LAB — CBC WITH DIFFERENTIAL/PLATELET
Basophils Absolute: 0.1 x10E3/uL (ref 0.0–0.2)
Basos: 1 %
EOS (ABSOLUTE): 0.3 x10E3/uL (ref 0.0–0.4)
Eos: 3 %
Hematocrit: 47 % (ref 37.5–51.0)
Hemoglobin: 15.3 g/dL (ref 13.0–17.7)
Immature Grans (Abs): 0.1 x10E3/uL (ref 0.0–0.1)
Immature Granulocytes: 1 %
Lymphocytes Absolute: 1.1 x10E3/uL (ref 0.7–3.1)
Lymphs: 9 %
MCH: 29.3 pg (ref 26.6–33.0)
MCHC: 32.6 g/dL (ref 31.5–35.7)
MCV: 90 fL (ref 79–97)
Monocytes Absolute: 0.7 x10E3/uL (ref 0.1–0.9)
Monocytes: 6 %
Neutrophils Absolute: 9.3 x10E3/uL — ABNORMAL HIGH (ref 1.4–7.0)
Neutrophils: 80 %
Platelets: 382 x10E3/uL (ref 150–450)
RBC: 5.23 x10E6/uL (ref 4.14–5.80)
RDW: 12.8 % (ref 11.6–15.4)
WBC: 11.5 x10E3/uL — ABNORMAL HIGH (ref 3.4–10.8)

## 2024-02-25 LAB — CMP14+EGFR
ALT: 16 IU/L (ref 0–44)
AST: 13 IU/L (ref 0–40)
Albumin: 4.3 g/dL (ref 3.9–4.9)
Alkaline Phosphatase: 80 IU/L (ref 47–123)
BUN/Creatinine Ratio: 13 (ref 10–24)
BUN: 11 mg/dL (ref 8–27)
Bilirubin Total: 0.4 mg/dL (ref 0.0–1.2)
CO2: 26 mmol/L (ref 20–29)
Calcium: 9.9 mg/dL (ref 8.6–10.2)
Chloride: 101 mmol/L (ref 96–106)
Creatinine, Ser: 0.82 mg/dL (ref 0.76–1.27)
Globulin, Total: 2.5 g/dL (ref 1.5–4.5)
Glucose: 88 mg/dL (ref 70–99)
Potassium: 5.6 mmol/L — ABNORMAL HIGH (ref 3.5–5.2)
Sodium: 139 mmol/L (ref 134–144)
Total Protein: 6.8 g/dL (ref 6.0–8.5)
eGFR: 97 mL/min/1.73 (ref >59)

## 2024-03-03 ENCOUNTER — Other Ambulatory Visit: Payer: Self-pay | Admitting: Nurse Practitioner

## 2024-03-03 DIAGNOSIS — I10 Essential (primary) hypertension: Secondary | ICD-10-CM

## 2024-03-06 ENCOUNTER — Other Ambulatory Visit: Payer: Self-pay | Admitting: Nurse Practitioner

## 2024-03-06 MED ORDER — BUSPIRONE HCL 15 MG PO TABS: 15.0000 mg | ORAL_TABLET | Freq: Three times a day (TID) | ORAL | 1 refills | Status: DC

## 2024-03-16 ENCOUNTER — Other Ambulatory Visit: Payer: Self-pay | Admitting: *Deleted

## 2024-03-16 DIAGNOSIS — J410 Simple chronic bronchitis: Secondary | ICD-10-CM

## 2024-03-24 ENCOUNTER — Other Ambulatory Visit: Payer: Self-pay | Admitting: Nurse Practitioner

## 2024-03-24 DIAGNOSIS — J410 Simple chronic bronchitis: Secondary | ICD-10-CM

## 2024-03-26 ENCOUNTER — Ambulatory Visit: Admitting: Internal Medicine

## 2024-03-26 ENCOUNTER — Encounter: Payer: Self-pay | Admitting: Internal Medicine

## 2024-03-26 VITALS — BP 143/89 | HR 83 | Ht 69.0 in | Wt 201.0 lb

## 2024-03-26 DIAGNOSIS — J4489 Other specified chronic obstructive pulmonary disease: Secondary | ICD-10-CM

## 2024-03-26 DIAGNOSIS — I10 Essential (primary) hypertension: Secondary | ICD-10-CM

## 2024-03-26 MED ORDER — METHYLPREDNISOLONE ACETATE 80 MG/ML IJ SUSP
120.0000 mg | Freq: Once | INTRAMUSCULAR | Status: AC
  Administered 2024-03-26: 120 mg via INTRAMUSCULAR

## 2024-03-26 MED ORDER — BREZTRI AEROSPHERE 160-9-4.8 MCG/ACT IN AERO: INHALATION_SPRAY | RESPIRATORY_TRACT | 11 refills | Status: AC

## 2024-03-26 MED ORDER — ALBUTEROL SULFATE (2.5 MG/3ML) 0.083% IN NEBU
2.5000 mg | INHALATION_SOLUTION | RESPIRATORY_TRACT | 12 refills | Status: AC | PRN
Start: 2024-03-26 — End: ?

## 2024-03-26 MED ORDER — BREZTRI AEROSPHERE 160-9-4.8 MCG/ACT IN AERO: 2.0000 | INHALATION_SPRAY | Freq: Two times a day (BID) | RESPIRATORY_TRACT | Status: AC

## 2024-03-26 MED ORDER — IRBESARTAN 300 MG PO TABS: 300.0000 mg | ORAL_TABLET | Freq: Every day | ORAL | 11 refills | Status: AC

## 2024-03-26 NOTE — Patient Instructions (Addendum)
 Plan A = Automatic = Always=    Breztri Take 2 puffs first thing in am and then another 2 puffs about 12 hours later.    Work on inhaler technique:  relax and gently blow all the way out then take a nice smooth full deep breath back in, triggering the inhaler at same time you start breathing in.  Hold breath in for at least  5 seconds if you can. Blow out Breztri/symbicort   thru nose. Rinse and gargle with water when done.  If mouth or throat bother you at all,  try brushing teeth/gums/tongue with arm and hammer toothpaste/ make a slurry and gargle and spit out.      Plan B = Backup (to supplement plan A, not to replace it) Use your albuterol  inhaler as a rescue medication to be used if you can't catch your breath by resting or slowing your pace  or doing a relaxed purse lip breathing pattern.  - The less you use it, the better it will work when you need it. - Ok to use the inhaler up to 2 puffs  every 4 hours if you must but call for appointment if use goes up over your usual need - Don't leave home without it !!  (think of it like the spare tire or starter fluid for your car)    Plan C = Crisis (instead of Plan B but only if Plan B stops working) - stop the duoneb (has ipatropium in it)  - only use your albuterol  nebulizer if you first try Plan B and it fails to help > ok to use the nebulizer up to every 4 hours but if start needing it regularly call for immediate appointment  Depomedrol 120 mg IM today   Stop lisinopril  and start Avapro 300 mg one daily in its place   Please schedule a follow up office visit in 4 weeks, sooner if needed  with all medications /inhalers/ solutions in hand so we can verify exactly what you are taking. This includes all medications from all doctors and over the counters

## 2024-03-26 NOTE — Progress Notes (Signed)
 Philip Ashley, male    DOB: 01/15/1961    MRN: [id number]   Brief patient profile:  63   yowm  quit smoking 2016 but then vaping  with h/o asthma in since his 30s just on as needed saba   referred to pulmonary clinic in [place]  03/26/2024 by Philip Ashley for worse  doe x 3 years needing daily saba/  quit vaping oct 2015 but no better yet.   Pt not previously seen by PCCM service.    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)  stopped working in July 2025    History of Present Illness  03/26/2024  Pulmonary/ 1st office eval/ Philip Ashley / [employer] on ACEi  Chief Complaint  Patient presents with   Establish Care    Doe - coughing mucus gets stuck   Dyspnea:  trouble weed eating  this year for the first time  Cough: light tan / no longer flares in am  Sleep: bed is flat / 1 pillow humidifier on side better and actually sleeping fine   SABA use: duoneb q 6 h x one month and proair  3 x daily also  02: none     No obvious day to day or daytime pattern/variability or assoc excess/ purulent sputum or mucus plugs or hemoptysis or cp or chest tightness, subjective wheeze or overt sinus or hb symptoms.    Also denies any obvious fluctuation of symptoms with weather or environmental changes or other aggravating or alleviating factors except as outlined above   No unusual exposure hx or h/o childhood pna/ asthma or knowledge of premature birth.  Current Allergies, Complete Past Medical History, Past Surgical History, Family History, and Social History were reviewed in [employer] [facility] record.  ROS  The following are not active complaints unless bolded Hoarseness, sore throat, dysphagia, dental problems, itching, sneezing,  nasal congestion or discharge of excess mucus or purulent secretions, ear ache,   fever, chills, sweats, unintended wt loss or wt gain, classically pleuritic or exertional cp,  orthopnea pnd or arm/hand swelling  or leg swelling, presyncope,  palpitations, abdominal pain, anorexia, nausea, vomiting, diarrhea  or change in bowel habits or change in bladder habits, change in stools or change in urine, dysuria, hematuria,  rash, arthralgias, visual complaints, headache, numbness, weakness or ataxia or problems with walking or coordination,  change in mood or  memory.            Outpatient Medications Prior to Visit  Medication Sig Dispense Refill   albuterol  (VENTOLIN  HFA) 108 (90 Base) MCG/ACT inhaler INHALE TWO PUFFS INTO THE LUNGS EVERY 4 HOURS AS NEEDED 8.5 g 1   Omega-3 Fatty Acids (FISH OIL) 1200 MG CPDR Take by mouth.     pravastatin  (PRAVACHOL ) 40 MG tablet Take 1 tablet (40 mg total) by mouth daily. 90 tablet 1   budesonide -formoterol  (SYMBICORT ) 160-4.5 MCG/ACT inhaler Inhale 2 puffs into the lungs 2 (two) times daily. 10.2 g 1   ipratropium-albuterol  (DUONEB) 0.5-2.5 (3) MG/3ML SOLN Take 3 mLs by nebulization every 6 (six) hours as needed. 180 mL 1   lisinopril  (ZESTRIL ) 40 MG tablet Take 1 tablet (40 mg total) by mouth daily. 90 tablet 1   celecoxib (CELEBREX) 200 MG capsule Take 1 capsule (200 mg total) by mouth 2 (two) times daily. (Patient not taking: Reported on 03/26/2024) 60 capsule 2   escitalopram  (LEXAPRO ) 20 MG tablet Take 1 tablet (20 mg total) by mouth daily. (Patient not taking: Reported on  03/26/2024) 30 tablet 5   busPIRone (BUSPAR) 15 MG tablet Take 1 tablet (15 mg total) by mouth 3 (three) times daily. 60 tablet 1   No facility-administered medications prior to visit.    Past Medical History:  Diagnosis Date   Hyperlipidemia       Objective:     BP (!) 143/89   Pulse 83   Ht 5' 9 (1.753 m)   Wt 201 lb (91.2 kg)   SpO2 95% Comment: ra  BMI 29.68 kg/m   SpO2: 95 % (ra) amb wm nad    HEENT : Oropharynx  clear   Nasal turbinates nl    NECK :  without  apparent JVD/ palpable Nodes/TM    LUNGS: no acc muscle use,  Min barrel  contour chest wall with bilateral  insp /exp rhonchi  and   without cough on insp or exp maneuvers and min  Hyperresonant  to  percussion bilaterally    CV:  RRR  no s3 or murmur or increase in P2, and no edema   ABD:  soft and nontender    MS:  Nl gait/ ext warm without deformities Or obvious joint restrictions  calf tenderness, cyanosis or clubbing     SKIN: warm and dry without lesions    NEURO:  alert, approp, nl sensorium with  no motor or cerebellar deficits apparent.         I personally reviewed images and agree with radiology impression as follows:  CXR:   pat and lateral   01/20/24  no active dz  My review: minimal copd changes     Assessment   Assessment & Plan Asthmatic bronchitis , chronic (HCC) Quit smoking 2016  -Allergy screen 03/26/2024 >  Eos 0. /  IgE     - alpha one AT phenotype 03/26/2024  - 03/26/2024  After extensive coaching inhaler device,  effectiveness =   75% (short ti) try breztri and more approp saba/ depomedrol 120 mg IM     Group E in terms of symptoms/risk so  laba/lama/ICS  therefore appropriate rx at this point >>>  breztri   and  more approp SABA prn.  Re SABA :  I spent extra time with pt today reviewing appropriate use of albuterol  for prn use on exertion with the following points: 1) saba is for relief of sob that does not improve by walking a slower pace or resting but rather if the pt does not improve after trying this first. 2) If the pt is convinced, as many are, that saba helps recover from activity faster then it's easy to tell if this is the case by re-challenging : ie stop, take the inhaler, then p 5 minutes try the exact same activity (intensity of workload) that just caused the symptoms and see if they are substantially diminished or not after saba 3) if there is an activity that reproducibly causes the symptoms, try the saba 15 min before the activity on alternate days   If in fact the saba really does help, then fine to continue to use it prn but advised may need to look closer at the  maintenance regimen being used to achieve better control of airways disease with exertion.     Essential hypertension D/c acei due to dtc AB 03/26/2024   Many features of his AB don't feel typical pattern in that he's worse since he stopped smoking and has no night time cough or wheezing flares ? Is this all atypical copd  or possible ACEi case?  In the best review of chronic cough to date ( NEJM 2016 375 [phone]) ,  ACEi are now felt to cause cough in up to  20% of pts which is a 4 fold increase from previous reports and does not include the variety of non-specific complaints we see in pulmonary clinic in pts on ACEi but previously attributed to another dx like  Copd/asthma and  include PNDS, throat and chest congestion, bronchitis, unexplained dyspnea and noct strangling sensations, and hoarseness, but also  atypical /refractory GERD symptoms like dysphagia and bad heartburn   The only way I know  to prove this is not an ACEi Case is a trial off ACEi x a minimum of 6 weeks then regroup.   >>> rec try avapro 300 mg q d (break in half if too strong by self monitor) x 6 weeks then return for recheck and schedule for pfts          Each maintenance medication was reviewed in detail including emphasizing most importantly the difference between maintenance and prns and under what circumstances the prns are to be triggered using an action plan format  (see ABC on AVS today)   Total time for H and P, chart review, counseling, reviewing hfa / neb  device(s) and generating customized AVS unique to this office visit / same day charting = 62 min new pt eval for multiple severe chronic  refractory respiratory  symptoms of uncertain etiology          AVS  Patient Instructions  Plan A = Automatic = Always=    Breztri Take 2 puffs first thing in am and then another 2 puffs about 12 hours later.    Work on inhaler technique:  relax and gently blow all the way out then take a nice smooth full  deep breath back in, triggering the inhaler at same time you start breathing in.  Hold breath in for at least  5 seconds if you can. Blow out Breztri/symbicort   thru nose. Rinse and gargle with water when done.  If mouth or throat bother you at all,  try brushing teeth/gums/tongue with arm and hammer toothpaste/ make a slurry and gargle and spit out.      Plan B = Backup (to supplement plan A, not to replace it) Use your albuterol  inhaler as a rescue medication to be used if you can't catch your breath by resting or slowing your pace  or doing a relaxed purse lip breathing pattern.  - The less you use it, the better it will work when you need it. - Ok to use the inhaler up to 2 puffs  every 4 hours if you must but call for appointment if use goes up over your usual need - Don't leave home without it !!  (think of it like the spare tire or starter fluid for your car)    Plan C = Crisis (instead of Plan B but only if Plan B stops working) - stop the duoneb (has ipatropium in it)  - only use your albuterol  nebulizer if you first try Plan B and it fails to help > ok to use the nebulizer up to every 4 hours but if start needing it regularly call for immediate appointment  Depomedrol 120 mg IM today   Stop lisinopril  and start Avapro 300 mg one daily in its place   Please schedule a follow up office visit in 4 weeks, sooner if needed  with  all medications /inhalers/ solutions in hand so we can verify exactly what you are taking. This includes all medications from all doctors and over the counters        Ozell America, MD 03/26/2024

## 2024-03-26 NOTE — Assessment & Plan Note (Addendum)
 D/c acei due to dtc AB 03/26/2024   Many features of his AB don't feel typical pattern in that he's worse since he stopped smoking and has no night time cough or wheezing flares ? Is this all atypical copd or possible ACEi case?  In the best review of chronic cough to date ( NEJM 2016 375 [phone]) ,  ACEi are now felt to cause cough in up to  20% of pts which is a 4 fold increase from previous reports and does not include the variety of non-specific complaints we see in pulmonary clinic in pts on ACEi but previously attributed to another dx like  Copd/asthma and  include PNDS, throat and chest congestion, bronchitis, unexplained dyspnea and noct strangling sensations, and hoarseness, but also  atypical /refractory GERD symptoms like dysphagia and bad heartburn   The only way I know  to prove this is not an ACEi Case is a trial off ACEi x a minimum of 6 weeks then regroup.   >>> rec try avapro 300 mg q d (break in half if too strong by self monitor) x 6 weeks then return for recheck and schedule for pfts          Each maintenance medication was reviewed in detail including emphasizing most importantly the difference between maintenance and prns and under what circumstances the prns are to be triggered using an action plan format  (see ABC on AVS today)   Total time for H and P, chart review, counseling, reviewing hfa / neb  device(s) and generating customized AVS unique to this office visit / same day charting = 62 min new pt eval for multiple severe chronic  refractory respiratory  symptoms of uncertain etiology

## 2024-03-26 NOTE — Assessment & Plan Note (Addendum)
 Quit smoking 2016  -Allergy screen 03/26/2024 >  Eos 0. /  IgE     - alpha one AT phenotype 03/26/2024  - 03/26/2024  After extensive coaching inhaler device,  effectiveness =   75% (short ti) try breztri and more approp saba/ depomedrol 120 mg IM     Group E in terms of symptoms/risk so  laba/lama/ICS  therefore appropriate rx at this point >>>  breztri   and  more approp SABA prn.  Re SABA :  I spent extra time with pt today reviewing appropriate use of albuterol  for prn use on exertion with the following points: 1) saba is for relief of sob that does not improve by walking a slower pace or resting but rather if the pt does not improve after trying this first. 2) If the pt is convinced, as many are, that saba helps recover from activity faster then it's easy to tell if this is the case by re-challenging : ie stop, take the inhaler, then p 5 minutes try the exact same activity (intensity of workload) that just caused the symptoms and see if they are substantially diminished or not after saba 3) if there is an activity that reproducibly causes the symptoms, try the saba 15 min before the activity on alternate days   If in fact the saba really does help, then fine to continue to use it prn but advised may need to look closer at the maintenance regimen being used to achieve better control of airways disease with exertion.

## 2024-03-30 LAB — IGE: IgE (Immunoglobulin E), Serum: 37 [IU]/mL (ref 6–495)

## 2024-03-31 ENCOUNTER — Ambulatory Visit: Payer: Self-pay | Admitting: Internal Medicine

## 2024-03-31 LAB — BASIC METABOLIC PANEL WITH GFR
BUN/Creatinine Ratio: 12 (ref 10–24)
BUN: 9 mg/dL (ref 8–27)
CO2: 26 mmol/L (ref 20–29)
Calcium: 9.9 mg/dL (ref 8.6–10.2)
Chloride: 99 mmol/L (ref 96–106)
Creatinine, Ser: 0.77 mg/dL (ref 0.76–1.27)
Glucose: 79 mg/dL (ref 70–99)
Potassium: 5.1 mmol/L (ref 3.5–5.2)
Sodium: 140 mmol/L (ref 134–144)
eGFR: 99 mL/min/1.73 (ref >59)

## 2024-03-31 LAB — CBC WITH DIFFERENTIAL/PLATELET
Basophils Absolute: 0.1 x10E3/uL (ref 0.0–0.2)
Basos: 1 %
EOS (ABSOLUTE): 0.4 x10E3/uL (ref 0.0–0.4)
Eos: 3 %
Hematocrit: 45.1 % (ref 37.5–51.0)
Hemoglobin: 15.1 g/dL (ref 13.0–17.7)
Immature Grans (Abs): 0 x10E3/uL (ref 0.0–0.1)
Immature Granulocytes: 0 %
Lymphocytes Absolute: 1 x10E3/uL (ref 0.7–3.1)
Lymphs: 9 %
MCH: 29.6 pg (ref 26.6–33.0)
MCHC: 33.5 g/dL (ref 31.5–35.7)
MCV: 88 fL (ref 79–97)
Monocytes Absolute: 0.7 x10E3/uL (ref 0.1–0.9)
Monocytes: 6 %
Neutrophils Absolute: 8.6 x10E3/uL — ABNORMAL HIGH (ref 1.4–7.0)
Neutrophils: 81 %
Platelets: 321 x10E3/uL (ref 150–450)
RBC: 5.1 x10E6/uL (ref 4.14–5.80)
RDW: 13.1 % (ref 11.6–15.4)
WBC: 10.8 x10E3/uL (ref 3.4–10.8)

## 2024-03-31 LAB — ALPHA-1-ANTITRYPSIN PHENOTYP: A-1 Antitrypsin: 156 mg/dL (ref 101–187)

## 2024-03-31 NOTE — Progress Notes (Signed)
 Atc x1 lmtcb

## 2024-04-02 ENCOUNTER — Ambulatory Visit: Payer: Self-pay

## 2024-04-02 MED ORDER — HYDROCHLOROTHIAZIDE 25 MG PO TABS: 25.0000 mg | ORAL_TABLET | Freq: Every day | ORAL | 1 refills | Status: DC

## 2024-04-02 NOTE — Telephone Encounter (Signed)
 I called and spoke with patient, advised of recommendations per Dr. Darlean.  He verbalized understanding.  I verified his pharmacy and script was sent electronically.  Nothing further needed.

## 2024-04-02 NOTE — Telephone Encounter (Signed)
 Dr. Darlean, This patient was seen on 03/26/2024, this was your recommendation:  Stop lisinopril  and start Avapro  300 mg one daily in its place.  He states he took the whole pill every day except for today.  I asked him why he only took 1/2 of the tablet today and he said he thought it would bring his BP down.  I Advised him that he is to take the entire pill daily to bring the BP down.  He stated his BP has been high all week despite taking the whole pill.  I let him know I would send a message to Dr. Darlean and once we hear back from him we will call him back with his recommendations.  He verbalized understanding.   Dr. Darlean, He has a f/u with you scheduled for 04/23/24. He also has a f/u with his PCP in January, 1/62026.   Should he see his PCP regarding his BP?  Please advise.  Thank you.

## 2024-04-02 NOTE — Telephone Encounter (Signed)
 FYI Only or Action Required?: Action required by provider: clinical question for provider.  Patient is followed in Pulmonology for COPD, last seen on 03/26/2024 by Darlean Ozell NOVAK, MD.  Called Nurse Triage reporting Hypertension.  Symptoms began several days ago.  Interventions attempted: Prescription medications: avapro.  Symptoms are: unchanged.  Triage Disposition: Home Care (overriding See PCP When Office is Open (Within 3 Days))  Patient/caregiver understands and will follow disposition?: Yes   Copied from CRM #[id number]. Topic: Clinical - Red Word Triage >> Apr 02, 2024  8:31 AM Leila BROCKS wrote: Red Word that prompted transfer to Nurse Triage: Patient [phone] states blood pressure and inhaler was changed from Dr. Darlean unsure of the names of medications. Patient states blood pressure is running high now: 149/87 03/29/24, 149/91 03/30/24, 168/93 03/31/24, 152/92 04/01/24 morning, 150/90 04/01/24 evening, 140/87 this morning. Patient states light head, and coughing still. Patient denies vision issues, shortness of breath, or wheezing. Patient toke only a half of a pill of the blood pressure not the whole pill today. Please advise. >> Apr 02, 2024  8:39 AM Leila BROCKS wrote: Patient [phone] states cannot hold any longer and wants NT to call patient back. Please advise and call back.  Reason for Disposition  Systolic BP >= 160 OR Diastolic >= 100  Answer Assessment - Initial Assessment Questions Pt states Dr. Darlean changed his inhaler and his BP meds. RN did reiterate that sometimes there will be fluctuations in BP as we are making adjustments to medications. Rn advised pt to continue to monitor his BP. He takes his meds about 0330-0400 and checks BP about 0530. RN advised pt to continue to do that.He states that he only took a half a pill this am. RN advised him to continue to take the whole pill unless his BP is low, then cut that in half. He did state he was slightly dizzy this am  but that went away quickly. He states he does still have a cough also and feels a little congested but states he thinks it's from the weather changing. He denies any shortness of breath worse than normal. Denies any chest pain or any other high acuity questions. He did say he used his rescue inhaler one time, one puff, out of anxiety before he could even think about it, but otherwise has not.  Bps: 11.16.25: 149/87 11.17.25: 149/91 11.18.25: 168/93 11.19.25: 152/92 am, 150/90pm 11.20.25: 140/87 RN advised pt that message would be sent to Dr. Chari team to see if anything additional needed to be done of it should just continue with the plan. RN advised him to continue with the plan unless he heard otherwise.     1. BLOOD PRESSURE: What is your blood pressure? Did you take at least two measurements 5 minutes apart?     140/87 this am.  2. ONSET: When did you take your blood pressure?     Takes it about 1.5-2 hours after medications.   3. HISTORY: Do you have a history of high blood pressure?     yes 4. MEDICINES: Are you taking any medicines for blood pressure? Have you missed any doses recently?     Avapro, only took half this am, had been taking a whole pill 5. OTHER SYMPTOMS: Do you have any symptoms? (e.g., blurred vision, chest pain, difficulty breathing, headache, weakness)     denies  Protocols used: Blood Pressure - High-A-AH

## 2024-04-07 ENCOUNTER — Ambulatory Visit: Payer: Self-pay | Admitting: Internal Medicine

## 2024-04-07 NOTE — Telephone Encounter (Signed)
 FYI Only or Action Required?: Action required by provider: Needs hydrochlorothiazide  resent electronically.  Patient is followed in Pulmonology for bronchitis/htn, last seen on 03/26/2024 by Darlean Ozell NOVAK, MD.  Called Nurse Triage reporting Medication Problem.  Symptoms began several days ago.  Interventions attempted: Prescription medications: Avapro .  Symptoms are: unchanged.  Dr Darlean prescribed hydrochlorothiazide  for patient on 11/20 for elevated BP- patient has not been able to pick up medication as it was sent to PRINT status instead of electronically prescribed. Pharmacy confirmed- Please reach out to patient when medication has been re-sent  BP has remained elevated 146/85- no new or concerning symptoms.   Copied from CRM #[id number]. Topic: Clinical - Red Word Triage >> Apr 07, 2024 11:02 AM Rilla NOVAK wrote: [facility] that prompted transfer to Nurse Triage: High Blood Pressure   ----------------------------------------------------------------------- From previous Reason for Contact - Prescription Issue: Reason for CRM: Patient call

## 2024-04-08 ENCOUNTER — Other Ambulatory Visit: Payer: Self-pay

## 2024-04-08 MED ORDER — HYDROCHLOROTHIAZIDE 25 MG PO TABS: 25.0000 mg | ORAL_TABLET | Freq: Every day | ORAL | 1 refills | Status: AC

## 2024-04-08 NOTE — Telephone Encounter (Signed)
 It looks like when this med was sent in it was on print - resent

## 2024-04-23 ENCOUNTER — Encounter: Payer: Self-pay | Admitting: Internal Medicine

## 2024-04-23 ENCOUNTER — Ambulatory Visit: Admitting: Internal Medicine

## 2024-04-23 VITALS — BP 134/79 | HR 86 | Ht 69.0 in | Wt 201.0 lb

## 2024-04-23 DIAGNOSIS — J4489 Other specified chronic obstructive pulmonary disease: Secondary | ICD-10-CM | POA: Diagnosis not present

## 2024-04-23 DIAGNOSIS — I10 Essential (primary) hypertension: Secondary | ICD-10-CM | POA: Diagnosis not present

## 2024-04-23 DIAGNOSIS — Z87891 Personal history of nicotine dependence: Secondary | ICD-10-CM | POA: Diagnosis not present

## 2024-04-23 NOTE — Progress Notes (Signed)
 Philip Ashley, male    DOB: [birth year]    MRN: [id number]   Brief patient profile:  63   yowm  quit smoking 2016 but then vaping  with h/o asthma in since his 30s just on as needed saba   referred to pulmonary clinic in [place]  03/26/2024 by Philip Ashley for worse  doe x 3 years needing daily saba/  quit vaping oct 2015 but no better yet.   Pt not previously seen by PCCM service.    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)  stopped working in July 2025    History of Present Illness  03/26/2024  Pulmonary/ 1st office eval/ Philip Ashley / [employer] on ACEi  Chief Complaint  Patient presents with   Establish Care    Doe - coughing mucus gets stuck   Dyspnea:  trouble weed eating  this year for the first time  Cough: light tan / no longer flares in am  Sleep: bed is flat / 1 pillow humidifier on side better and actually sleeping fine   SABA use: duoneb q 6 h x one month and proair  3 x daily also  02: none  R= Patient Instructions  Plan A = Automatic = Always=    Breztri  Take 2 puffs first thing in am and then another 2 puffs about 12 hours later.   Work on inhaler technique:   Plan B = Backup (to supplement plan A, not to replace it) Use your albuterol  inhaler as a rescue medication  Plan C = Crisis (instead of Plan B  Depomedrol 120 mg IM today  Stop lisinopril  and start Avapro  300 mg one daily in its place   Please schedule a follow up office visit in 4 weeks, sooner if needed  with all medications /inhalers/ solutions in hand     04/23/2024  f/u ov/[place] [facility]/Philip Ashley re: AB/HBP ? 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    Also denies any obvious fluctuation of symptoms with weather or environmental changes or other aggravating or alleviating factors except as outlined above   No unusual exposure hx or h/o childhood pna/ asthma or knowledge of premature birth.  Current Allergies, Complete Past Medical History, Past Surgical History, Family History, and Social History were reviewed in [employer] record.  ROS  The following are not active complaints unless bolded Hoarseness, sore throat, dysphagia, dental problems, itching, sneezing,  nasal congestion or discharge of excess mucus or purulent secretions, ear ache,   fever, chills, sweats, unintended wt loss or wt gain, classically pleuritic or exertional cp,  orthopnea pnd or arm/hand swelling  or leg swelling, presyncope, palpitations, abdominal pain, anorexia, nausea, vomiting, diarrhea  or change in bowel habits or change in bladder habits, change in stools or change in urine, dysuria, hematuria,  rash, arthralgias, visual complaints, headache, numbness, weakness or ataxia or problems with walking or coordination,  change in mood or  memory.        Current Meds  Medication Sig   albuterol  (VENTOLIN  HFA) 108 (90 Base) MCG/ACT inhaler INHALE TWO PUFFS INTO THE LUNGS EVERY 4 HOURS AS NEEDED   budesonide -glycopyrrolate-formoterol  (BREZTRI  AEROSPHERE) 160-9-4.8 MCG/ACT AERO inhaler Take 2 puffs first thing in am and then another 2 puffs about 12 hours later.   hydrochlorothiazide  (HYDRODIURIL ) 25 MG tablet Take 1 tablet (25 mg total) by mouth daily.   irbesartan  (AVAPRO ) 300 MG tablet Take 1 tablet (300 mg total) by mouth daily.   Omega-3 Fatty Acids (FISH OIL) 1200 MG CPDR Take by mouth.   pravastatin  (PRAVACHOL ) 40 MG tablet     Past Medical History:  Diagnosis Date   Hyperlipidemia       Objective:     Wt Readings from Last 3 Encounters:  04/23/24 201 lb (91.2 kg)  03/26/24 201 lb (91.2 kg)  02/24/24 199 lb (90.3 kg)    Vital signs reviewed  04/23/2024  - Note at rest 02 sats 95% or RA   General appearance:    pleasant minimally hoarse amb wm nad     HEENT : Oropharynx clear   Nasal turbinates nl    NECK :  without  apparent JVD/ palpable Nodes/TM    LUNGS: no acc muscle use,  Min barrel  contour chest wall with bilateral  slightly decreased bs s audible wheeze and  without cough on insp or exp maneuvers and min  Hyperresonant  to  percussion bilaterally    CV:  RRR  no s3 or murmur or increase in P2, and no edema   ABD:  soft and nontender    MS:  Nl gait/ ext warm without deformities Or obvious joint restrictions  calf tenderness, cyanosis or clubbing     SKIN: warm and dry without lesions    NEURO:  alert, approp, nl sensorium with  no motor or cerebellar deficits apparent.            Assessment   Assessment & Plan Asthmatic bronchitis , chronic (HCC) Quit smoking 2016  -Allergy screen 03/26/2024 >  Eos 0. 4/  IgE 37  - alpha one AT phenotype 03/26/2024 MS level 156  - 03/26/2024  After extensive coaching inhaler device,  effectiveness =   75% (short ti) try breztri  and more approp saba/ depomedrol 120 mg IM  > much improved 04/23/2024 but slightly hoarse > rec arm and hammer toothpaste/ consider weaning pm dose or using spacer if not better    Group E in terms of symptoms/risk so  laba/lama/ICS  therefore appropriate rx at this point   >>>  continue Breztri    and approp SABA prn  >>> f.u 3 m with PFTs.    Former cigarette smoker Low-dose CT lung cancer screening is recommended for patients who are 56-82 years of age with a 20+ pack-year history of smoking and who are currently smoking or quit <=15 years ago. No coughing up blood  No unintentional weight loss of > 15 pounds in the last 6 months - pt is eligible for scanning yearly until 2031   >>>   referred 04/23/2024    Essential hypertension D/c acei due to dtc AB 03/26/2024 > much better 04/23/2024   Although even in retrospect it may not be clear the ACEi contributed to the pt's symptoms,  Pt improved off them and adding them back at this point or in the future would risk confusion in interpretation of non-specific  respiratory symptoms to which this patient is prone  ie  Better not to muddy the waters here.   BP well contreolled on Avapro  300 plus hydrochlorothiazide  25 mg daily  >>> no change in rx f/u per PCP          Each maintenance medication was reviewed in detail including emphasizing most importantly the difference between maintenance and prns and under what circumstances the prns are to be triggered using an action plan format where appropriate.  Total time for H and P, chart review, counseling, reviewing hfa device(s) and generating customized AVS unique to this office visit / same day charting = 30 min          AVS  Patient Instructions  No change in medications for now though if breztri  bothers your throat can try reducing pm dose to 1 puff then 0 if continues to bother  you  Work on inhaler technique:  relax and gently blow all the way out then take a nice smooth full deep breath back in, triggering the inhaler at same time you start breathing in.  Hold breath in for at least  5 seconds if you can. Blow out Breztri   thru nose. Rinse and gargle with water when done.  If mouth or throat bother you at all,  try brushing teeth/gums/tongue with arm and hammer toothpaste/ make a slurry and gargle and spit out.    Only take the fish oil with a meal not at bedtime and  NO MINT /menthol or chocolate products    Please schedule a follow up visit in 3 months but call sooner if needed  with PFTs on return            Philip America, MD 04/25/2024

## 2024-04-23 NOTE — Patient Instructions (Addendum)
 No change in medications for now though if breztri  bothers your throat can try reducing pm dose to 1 puff then 0 if continues to bother  you  Work on inhaler technique:  relax and gently blow all the way out then take a nice smooth full deep breath back in, triggering the inhaler at same time you start breathing in.  Hold breath in for at least  5 seconds if you can. Blow out Breztri   thru nose. Rinse and gargle with water when done.  If mouth or throat bother you at all,  try brushing teeth/gums/tongue with arm and hammer toothpaste/ make a slurry and gargle and spit out.    Only take the fish oil with a meal not at bedtime and  NO MINT /menthol or chocolate products    Please schedule a follow up visit in 3 months but call sooner if needed  with PFTs on return

## 2024-04-25 ENCOUNTER — Encounter: Payer: Self-pay | Admitting: Internal Medicine

## 2024-04-25 NOTE — Assessment & Plan Note (Addendum)
 D/c acei due to dtc AB 03/26/2024 > much better 04/23/2024   Although even in retrospect it may not be clear the ACEi contributed to the pt's symptoms,  Pt improved off them and adding them back at this point or in the future would risk confusion in interpretation of non-specific respiratory symptoms to which this patient is prone  ie  Better not to muddy the waters here.   BP well contreolled on Avapro  300 plus hydrochlorothiazide  25 mg daily  >>> no change in rx f/u per PCP          Each maintenance medication was reviewed in detail including emphasizing most importantly the difference between maintenance and prns and under what circumstances the prns are to be triggered using an action plan format where appropriate.  Total time for H and P, chart review, counseling, reviewing hfa device(s) and generating customized AVS unique to this office visit / same day charting = 30 min

## 2024-04-25 NOTE — Assessment & Plan Note (Addendum)
 Low-dose CT lung cancer screening is recommended for patients who are 92-65 years of age with a 20+ pack-year history of smoking and who are currently smoking or quit <=15 years ago. No coughing up blood  No unintentional weight loss of > 15 pounds in the last 6 months - pt is eligible for scanning yearly until 2031   >>>  referred 04/23/2024

## 2024-04-25 NOTE — Assessment & Plan Note (Addendum)
 Quit smoking 2016  -Allergy screen 03/26/2024 >  Eos 0. 4/  IgE 37  - alpha one AT phenotype 03/26/2024 MS level 156  - 03/26/2024  After extensive coaching inhaler device,  effectiveness =   75% (short ti) try breztri  and more approp saba/ depomedrol 120 mg IM  > much improved 04/23/2024 but slightly hoarse > rec arm and hammer toothpaste/ consider weaning pm dose or using spacer if not better    Group E in terms of symptoms/risk so  laba/lama/ICS  therefore appropriate rx at this point   >>>  continue Breztri    and approp SABA prn  >>> f.u 3 m with PFTs.

## 2024-05-19 ENCOUNTER — Encounter: Payer: Self-pay | Admitting: Nurse Practitioner

## 2024-05-19 ENCOUNTER — Ambulatory Visit (INDEPENDENT_AMBULATORY_CARE_PROVIDER_SITE_OTHER): Payer: Self-pay | Admitting: Nurse Practitioner

## 2024-05-19 VITALS — BP 132/82 | HR 82 | Temp 97.7°F | Ht 69.0 in | Wt 208.0 lb

## 2024-05-19 DIAGNOSIS — Z0000 Encounter for general adult medical examination without abnormal findings: Secondary | ICD-10-CM

## 2024-05-19 DIAGNOSIS — M25552 Pain in left hip: Secondary | ICD-10-CM

## 2024-05-19 NOTE — Patient Instructions (Signed)
What are Advance Directives? ?A living will allows you to document your wishes concerning medical treatments at the end of life.  ? ?Before your living will can guide medical decision-making two physicians must certify: ?You are unable to make medical decisions,  ?You are in the medical condition specified in the state's living will law (such as "terminal illness" or "permanent unconsciousness"),  ?Other requirements also may apply, depending upon the state. ?A medical power of attorney (or healthcare proxy) allows you to appoint a person you trust as your healthcare agent (or surrogate decision maker), who is authorized to make medical decisions on your behalf.  ? ?Before a medical power of attorney goes into effect a person?s physician must conclude that they are unable to make their own medical decisions. In addition: ?If a person regains the ability to make decisions, the agent cannot continue to act on the person's behalf.  ?Many states have additional requirements that apply only to decisions about life-sustaining medical treatments.  ?For example, before your agent can refuse a life-sustaining treatment on your behalf, a second physician may have to confirm your doctor's assessment that you are incapable of making treatment decisions. ?What Else Do I Need to Know?  ?Advance directives are legally valid throughout the [place]. While you do not need a lawyer to fill out an advance directive, your advance directive becomes legally valid as soon as you sign them in front of the required witnesses. The laws governing advance directives vary from state to state, so it is important to complete and sign advance directives that comply with your state's law. Also, advance directives can have different titles in different states.  ?Emergency medical technicians cannot honor living wills or medical powers of attorney. Once emergency personnel have been called, they must do what is necessary to stabilize a person  for transfer to a hospital, both from accident sites and from a home or other facility. After a physician fully evaluates the person's condition and determines the underlying conditions, advance directives can be implemented.  ?One state?s advance directive does not always work in another state. Some states do honor advance directives from another state; others will honor out-of-state advance directives as long as they are similar to the state's own law; and some states do not have an answer to this question. The best solution is if you spend a significant amount of time in more than one state, you should complete the advance directives for all the states you spend a significant amount of time in.  ?Advance directives do not expire. An advance directive remains in effect until you change it. If you complete a new advance directive, it invalidates the previous one.  ?You should review your advance directives periodically to ensure that they still reflect your wishes. If you want to change anything in an advance directive once you have completed it, you should complete a whole new document. ?[place] ? ? ? ?[facility], www.nhpco.org ? ?

## 2024-05-19 NOTE — Progress Notes (Signed)
 "  Chief Complaint  Patient presents with   Welcome to Medicare     Subjective:   Philip Ashley is a 64 y.o. male who presents for a Welcome to Medicare Exam.   Fall Screening Falls in the past year?: 0    Allergies (verified) Buspirone  hcl   Current Medications (verified) Outpatient Encounter Medications as of 05/19/2024  Medication Sig   albuterol  (PROVENTIL ) (2.5 MG/3ML) 0.083% nebulizer solution Take 3 mLs (2.5 mg total) by nebulization every 4 (four) hours as needed.   albuterol  (VENTOLIN  HFA) 108 (90 Base) MCG/ACT inhaler INHALE TWO PUFFS INTO THE LUNGS EVERY 4 HOURS AS NEEDED   budesonide -glycopyrrolate-formoterol  (BREZTRI  AEROSPHERE) 160-9-4.8 MCG/ACT AERO inhaler Take 2 puffs first thing in am and then another 2 puffs about 12 hours later.   hydrochlorothiazide  (HYDRODIURIL ) 25 MG tablet Take 1 tablet (25 mg total) by mouth daily.   irbesartan  (AVAPRO ) 300 MG tablet Take 1 tablet (300 mg total) by mouth daily.   Omega-3 Fatty Acids (FISH OIL) 1200 MG CPDR Take by mouth.   pravastatin  (PRAVACHOL ) 40 MG tablet Take 1 tablet (40 mg total) by mouth daily.   [DISCONTINUED] escitalopram  (LEXAPRO ) 20 MG tablet Take 1 tablet (20 mg total) by mouth daily.   celecoxib  (CELEBREX ) 200 MG capsule Take 1 capsule (200 mg total) by mouth 2 (two) times daily. (Patient not taking: Reported on 04/23/2024)   No facility-administered encounter medications on file as of 05/19/2024.    History: Past Medical History:  Diagnosis Date   Hyperlipidemia    No past surgical history on file. No family history on file. Social History   Occupational History   Not on file  Tobacco Use   Smoking status: Former    Current packs/day: 0.00    Average packs/day: 3.0 packs/day for 38.0 years (114.0 ttl pk-yrs)    Types: Cigarettes    Start date: 30    Quit date: 2016    Years since quitting: 10.0   Smokeless tobacco: Not on file  Substance and Sexual Activity   Alcohol use: Not on file   Drug  use: Not on file   Sexual activity: Not on file   Tobacco Counseling Counseling given: Not Answered  SDOH Screenings   Food Insecurity: No Food Insecurity (05/19/2024)  Housing: Low Risk (05/19/2024)  Transportation Needs: No Transportation Needs (05/19/2024)  Utilities: Not At Risk (05/19/2024)  Depression (PHQ2-9): Low Risk (05/19/2024)  Physical Activity: Inactive (05/19/2024)  Social Connections: Moderately Isolated (05/19/2024)  Stress: Stress Concern Present (05/19/2024)  Tobacco Use: Medium Risk (05/19/2024)  Health Literacy: Inadequate Health Literacy (05/19/2024)   See flowsheets for full screening details  Depression Screen PHQ 2 & 9 Depression Scale- Over the past 2 weeks, how often have you been bothered by any of the following problems? Little interest or pleasure in doing things: 0 Feeling down, depressed, or hopeless (PHQ Adolescent also includes...irritable): 0 PHQ-2 Total Score: 0 Trouble falling or staying asleep, or sleeping too much: 0 Feeling tired or having little energy: 0 Poor appetite or overeating (PHQ Adolescent also includes...weight loss): 0 Feeling bad about yourself - or that you are a failure or have let yourself or your family down: 0 Trouble concentrating on things, such as reading the newspaper or watching television (PHQ Adolescent also includes...like school work): 0 Moving or speaking so slowly that other people could have noticed. Or the opposite - being so fidgety or restless that you have been moving around a lot more than usual: 0 Thoughts  that you would be better off dead, or of hurting yourself in some way: 0 PHQ-9 Total Score: 0 If you checked off any problems, how difficult have these problems made it for you to do your work, take care of things at home, or get along with other people?: Not difficult at all  Depression Treatment Depression Interventions/Treatment : Medication      Goals Addressed             This Visit's Progress    DIET - EAT  MORE FRUITS AND VEGETABLES       Exercise 150 min/wk Moderate Activity               Objective:    Today's Vitals   05/19/24 1358  BP: 132/82  Pulse: 82  Temp: 97.7 F (36.5 C)  TempSrc: Temporal  SpO2: 96%  Weight: 208 lb (94.3 kg)  Height: 5' 9 (1.753 m)   Body mass index is 30.72 kg/m.   Physical Exam{(optional), or other factors deemed appropriate based on the beneficiary's medical and social history and current clinical standards.   Hearing/Vision screen No results found. Immunizations and Health Maintenance Health Maintenance  Topic Date Due   Medicare Annual Wellness (AWV)  Never done   HIV Screening  Never done   Colonoscopy  Never done   Lung Cancer Screening  Never done   Zoster Vaccines- Shingrix (1 of 2) Never done   COVID-19 Vaccine (1 - 2025-26 season) Never done   DTaP/Tdap/Td (1 - Tdap) 07/24/2024 (Originally 06/06/1979)   Pneumococcal Vaccine: 50+ Years (1 of 2 - PCV) 07/24/2024 (Originally 06/06/1979)   Influenza Vaccine  08/11/2024 (Originally 12/13/2023)   Hepatitis C Screening  Completed   Hepatitis B Vaccines 19-59 Average Risk  Aged Out   Meningococcal B Vaccine  Aged Out    EKG: normal EKG, normal sinus rhythm, unchanged from previous tracings- done on 07/25/23     Assessment/Plan:  This is a routine wellness examination for Philip Ashley.  Patient Care Team: Gladis Mustard, FNP as PCP - General (Nurse Practitioner) Darlean Ozell NOVAK, MD as Consulting Physician (Pulmonary Disease)  I have personally reviewed and noted the following in the patients chart:   Medical and social history Use of alcohol, tobacco or illicit drugs  Current medications and supplements including opioid prescriptions. Functional ability and status Nutritional status Physical activity Advanced directives List of other physicians Hospitalizations, surgeries, and ER visits in previous 12 months Vitals Screenings to include cognitive, depression, and  falls Referrals and appointments  No orders of the defined types were placed in this encounter.  In addition, I have reviewed and discussed with patient certain preventive protocols, quality metrics, and best practice recommendations. A written personalized care plan for preventive services as well as general preventive health recommendations were provided to patient.   Mary-Margaret Gladis, FNP   05/19/2024   No follow-ups on file.  "

## 2024-05-26 NOTE — Progress Notes (Signed)
 Called and relayed results pt confirmed understanding

## 2024-05-28 ENCOUNTER — Other Ambulatory Visit: Payer: Self-pay | Admitting: Nurse Practitioner

## 2024-05-28 ENCOUNTER — Other Ambulatory Visit: Payer: Self-pay | Admitting: Internal Medicine

## 2024-07-09 ENCOUNTER — Ambulatory Visit: Admitting: Internal Medicine

## 2024-07-09 ENCOUNTER — Encounter (HOSPITAL_COMMUNITY)

## 2024-08-13 ENCOUNTER — Ambulatory Visit: Admitting: Nurse Practitioner
